# Patient Record
Sex: Female | Born: 1990 | Race: Black or African American | Hispanic: No | Marital: Single | Smoking: Current some day smoker
Health system: Southern US, Community
[De-identification: ages and names within clinical notes are randomized; demographics above are authoritative.]

## PROBLEM LIST (undated history)

## (undated) DIAGNOSIS — E119 Type 2 diabetes mellitus without complications: Secondary | ICD-10-CM

## (undated) HISTORY — PX: NO PAST SURGERIES: SHX2092

---
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
## 2010-01-24 ENCOUNTER — Emergency Department (HOSPITAL_COMMUNITY): Admission: EM | Admit: 2010-01-24 | Discharge: 2010-01-24 | Payer: Self-pay | Admitting: Emergency Medicine

## 2011-08-27 ENCOUNTER — Emergency Department (HOSPITAL_COMMUNITY): Payer: Self-pay

## 2011-08-27 ENCOUNTER — Emergency Department (HOSPITAL_COMMUNITY)
Admission: EM | Admit: 2011-08-27 | Discharge: 2011-08-27 | Disposition: A | Discharge status: Home / Self Care | Payer: Self-pay | Attending: Emergency Medicine | Admitting: Emergency Medicine

## 2011-08-27 DIAGNOSIS — T398X2A Poisoning by other nonopioid analgesics and antipyretics, not elsewhere classified, intentional self-harm, initial encounter: Secondary | ICD-10-CM | POA: Insufficient documentation

## 2011-08-27 DIAGNOSIS — F3289 Other specified depressive episodes: Secondary | ICD-10-CM | POA: Insufficient documentation

## 2011-08-27 DIAGNOSIS — T394X2A Poisoning by antirheumatics, not elsewhere classified, intentional self-harm, initial encounter: Secondary | ICD-10-CM | POA: Insufficient documentation

## 2011-08-27 DIAGNOSIS — T391X1A Poisoning by 4-Aminophenol derivatives, accidental (unintentional), initial encounter: Secondary | ICD-10-CM | POA: Insufficient documentation

## 2011-08-27 DIAGNOSIS — F329 Major depressive disorder, single episode, unspecified: Secondary | ICD-10-CM | POA: Insufficient documentation

## 2011-08-27 DIAGNOSIS — R072 Precordial pain: Secondary | ICD-10-CM | POA: Insufficient documentation

## 2011-08-27 DIAGNOSIS — J45909 Unspecified asthma, uncomplicated: Secondary | ICD-10-CM | POA: Insufficient documentation

## 2011-08-27 LAB — COMPREHENSIVE METABOLIC PANEL
ALT: 15 U/L (ref 0–35)
Alkaline Phosphatase: 75 U/L (ref 39–117)
Chloride: 102 mEq/L (ref 96–112)
GFR calc Af Amer: >60 mL/min (ref >60)
Glucose, Bld: 149 mg/dL — ABNORMAL HIGH (ref 70–99)
Potassium: 3.8 mEq/L (ref 3.5–5.1)
Sodium: 136 mEq/L (ref 135–145)
Total Protein: 7.1 g/dL (ref 6.0–8.3)

## 2011-08-27 LAB — DIFFERENTIAL
Basophils Absolute: 0 10*3/uL (ref 0.0–0.1)
Lymphocytes Relative: 30 % (ref 12–46)
Lymphs Abs: 2.3 10*3/uL (ref 0.7–4.0)
Monocytes Absolute: 0.4 10*3/uL (ref 0.1–1.0)
Monocytes Relative: 6 % (ref 3–12)
Neutro Abs: 4.9 10*3/uL (ref 1.7–7.7)

## 2011-08-27 LAB — RAPID URINE DRUG SCREEN, HOSP PERFORMED
Amphetamines: NOT DETECTED
Benzodiazepines: NOT DETECTED
Opiates: NOT DETECTED

## 2011-08-27 LAB — URINALYSIS, ROUTINE W REFLEX MICROSCOPIC
Glucose, UA: NEGATIVE mg/dL
Ketones, ur: NEGATIVE mg/dL
Leukocytes, UA: NEGATIVE
pH: 5.5 (ref 5.0–8.0)

## 2011-08-27 LAB — CBC
HCT: 35.2 % — ABNORMAL LOW (ref 36.0–46.0)
Hemoglobin: 11.3 g/dL — ABNORMAL LOW (ref 12.0–15.0)
MCH: 26 pg (ref 26.0–34.0)
MCHC: 32.1 g/dL (ref 30.0–36.0)
MCV: 81.1 fL (ref 78.0–100.0)

## 2011-08-27 LAB — PROTIME-INR: INR: 0.93 (ref 0.00–1.49)

## 2011-09-06 LAB — URINALYSIS, ROUTINE W REFLEX MICROSCOPIC
Bilirubin Urine: NEGATIVE
Glucose, UA: NEGATIVE
Hgb urine dipstick: NEGATIVE
Ketones, ur: NEGATIVE
pH: 6.5

## 2011-09-06 LAB — WET PREP, GENITAL
Trich, Wet Prep: NONE SEEN
Yeast Wet Prep HPF POC: NONE SEEN

## 2011-09-06 LAB — CBC
Platelets: 375
WBC: 13 — ABNORMAL HIGH

## 2012-01-24 ENCOUNTER — Encounter (HOSPITAL_COMMUNITY): Payer: Self-pay

## 2012-01-24 ENCOUNTER — Inpatient Hospital Stay (HOSPITAL_COMMUNITY): Payer: Self-pay

## 2012-01-24 ENCOUNTER — Inpatient Hospital Stay (HOSPITAL_COMMUNITY)
Admission: AD | Admit: 2012-01-24 | Discharge: 2012-01-25 | Disposition: A | Discharge status: Home / Self Care | Payer: Self-pay | Source: Ambulatory Visit | Attending: Family Medicine | Admitting: Family Medicine

## 2012-01-24 DIAGNOSIS — N949 Unspecified condition associated with female genital organs and menstrual cycle: Secondary | ICD-10-CM | POA: Insufficient documentation

## 2012-01-24 DIAGNOSIS — N926 Irregular menstruation, unspecified: Secondary | ICD-10-CM

## 2012-01-24 DIAGNOSIS — N938 Other specified abnormal uterine and vaginal bleeding: Secondary | ICD-10-CM | POA: Insufficient documentation

## 2012-01-24 DIAGNOSIS — N939 Abnormal uterine and vaginal bleeding, unspecified: Secondary | ICD-10-CM

## 2012-01-24 LAB — CBC
Hemoglobin: 10.5 g/dL — ABNORMAL LOW (ref 12.0–15.0)
Platelets: 358 10*3/uL (ref 150–400)
RBC: 4.1 MIL/uL (ref 3.87–5.11)
WBC: 13 10*3/uL — ABNORMAL HIGH (ref 4.0–10.5)

## 2012-01-24 LAB — URINALYSIS, ROUTINE W REFLEX MICROSCOPIC
Ketones, ur: NEGATIVE mg/dL
Leukocytes, UA: NEGATIVE
Nitrite: NEGATIVE
Specific Gravity, Urine: 1.025 (ref 1.005–1.030)
Urobilinogen, UA: 0.2 mg/dL (ref 0.0–1.0)
pH: 6 (ref 5.0–8.0)

## 2012-01-24 LAB — URINE MICROSCOPIC-ADD ON

## 2012-01-24 NOTE — ED Provider Notes (Signed)
Dana Crockett20 y.o.[id number] with negative UPT  Chief Complaint  Patient presents with  . Vaginal Bleeding    SUBJECTIVE  HPI: Pt presents with bright red vaginal bleeding x3 weeks, soaking 1pad every hour the entire time.  She denies abdominal pain/cramping, dizziness, fatigue, dysuria, vaginal itching/burning, n/v, fever/chills.  Past Medical History  Diagnosis Date  . No pertinent past medical history    Past Surgical History  Procedure Date  . No past surgeries    History   Social History  . Marital Status: Single    Spouse Name: N/A    Number of Children: N/A  . Years of Education: N/A   Occupational History  . Not on file.   Social History Main Topics  . Smoking status: Current Everyday Smoker    Types: Cigarettes  . Smokeless tobacco: Not on file  . Alcohol Use:   . Drug Use: No  . Sexually Active: Not Currently    Birth Control/ Protection: None   Other Topics Concern  . Not on file   Social History Narrative  . No narrative on file   No current facility-administered medications on file prior to encounter.   No current outpatient prescriptions on file prior to encounter.   No Known Allergies  ROS: Pertinent items in HPI  OBJECTIVE Blood pressure 125/63, pulse 98, temperature 98.1 F (36.7 C), resp. rate 18, height 5\' 4"  (1.626 m), weight 98.431 kg (217 lb), last menstrual period 01/03/2012. GENERAL: Well-developed, well-nourished female in no acute distress.  LUNGS: Clear to auscultation bilaterally.  HEART: Regular rate and rhythm. ABDOMEN: Soft, nontender EXTREMITIES: Nontender, no edema EXTERNAL GENITALIA: normal VAGINA: Bright red vaginal bleeding CERVIX: long/closed/high   RESULTS Results for orders placed during the hospital encounter of 01/24/12 (from the past 24 hour(s))  URINALYSIS, ROUTINE W REFLEX MICROSCOPIC     Status: Abnormal   Collection Time   01/24/12  9:10 PM      Component Value Range   Color, Urine RED (*) YELLOW      APPearance CLOUDY (*) CLEAR    Specific Gravity, Urine 1.025  1.005 - 1.030    pH 6.0  5.0 - 8.0    Glucose, UA NEGATIVE  NEGATIVE (mg/dL)   Hgb urine dipstick LARGE (*) NEGATIVE    Bilirubin Urine NEGATIVE  NEGATIVE    Ketones, ur NEGATIVE  NEGATIVE (mg/dL)   Protein, ur 161 (*) NEGATIVE (mg/dL)   Urobilinogen, UA 0.2  0.0 - 1.0 (mg/dL)   Nitrite NEGATIVE  NEGATIVE    Leukocytes, UA NEGATIVE  NEGATIVE   URINE MICROSCOPIC-ADD ON     Status: Abnormal   Collection Time   01/24/12  9:10 PM      Component Value Range   Squamous Epithelial / LPF RARE  RARE    WBC, UA 0-2  <3 (WBC/hpf)   RBC / HPF TOO NUMEROUS TO COUNT  <3 (RBC/hpf)   Bacteria, UA MANY (*) RARE   POCT PREGNANCY, URINE     Status: Normal   Collection Time   01/24/12  9:33 PM      Component Value Range   Preg Test, Ur NEGATIVE  NEGATIVE   CBC     Status: Abnormal   Collection Time   01/24/12 10:25 PM      Component Value Range   WBC 13.0 (*) 4.0 - 10.5 (K/uL)   RBC 4.10  3.87 - 5.11 (MIL/uL)   Hemoglobin 10.5 (*) 12.0 - 15.0 (g/dL)   HCT 09.6 (*) 04.5 -  46.0 (%)   MCV 81.7  78.0 - 100.0 (fL)   MCH 25.6 (*) 26.0 - 34.0 (pg)   MCHC 31.3  30.0 - 36.0 (g/dL)   RDW 16.1 (*) 09.6 - 15.5 (%)   Platelets 358  150 - 400 (K/uL)     IMAGING US Transvaginal Non-ob  01/24/2012  *RADIOLOGY REPORT*  Clinical Data: Vaginal bleeding for 4 weeks.  TRANSABDOMINAL AND TRANSVAGINAL ULTRASOUND OF PELVIS Technique:  Both transabdominal and transvaginal ultrasound examinations of the pelvis were performed. Transabdominal technique was performed for global imaging of the pelvis including uterus, ovaries, adnexal regions, and pelvic cul-de-sac.  Comparison: None.   It was necessary to proceed with endovaginal exam following the transabdominal exam to visualize the uterus and ovaries in greater detail.  Findings:  Uterus: Normal in size and appearance; measures 6.2 x 3.2 x 4.3 cm.  Endometrium: Normal in thickness and appearance; measures 0.7  cm in thickness.  Right ovary:  Normal appearance/no adnexal mass; measures 3.0 x 2.0 x 1.8 cm.  Left ovary: Normal appearance/no adnexal mass; measures 3.1 x 1.6 x 2.1 cm.  Other findings: No free fluid is seen within the pelvic cul-de-sac.  IMPRESSION: Normal study.  No evidence of pelvic mass or other significant abnormality.  Original Report Authenticated By: Tonia Ghent, M.D.   US Pelvis Complete  01/24/2012  *RADIOLOGY REPORT*  Clinical Data: Vaginal bleeding for 4 weeks.  TRANSABDOMINAL AND TRANSVAGINAL ULTRASOUND OF PELVIS Technique:  Both transabdominal and transvaginal ultrasound examinations of the pelvis were performed. Transabdominal technique was performed for global imaging of the pelvis including uterus, ovaries, adnexal regions, and pelvic cul-de-sac.  Comparison: None.   It was necessary to proceed with endovaginal exam following the transabdominal exam to visualize the uterus and ovaries in greater detail.  Findings:  Uterus: Normal in size and appearance; measures 6.2 x 3.2 x 4.3 cm.  Endometrium: Normal in thickness and appearance; measures 0.7 cm in thickness.  Right ovary:  Normal appearance/no adnexal mass; measures 3.0 x 2.0 x 1.8 cm.  Left ovary: Normal appearance/no adnexal mass; measures 3.1 x 1.6 x 2.1 cm.  Other findings: No free fluid is seen within the pelvic cul-de-sac.  IMPRESSION: Normal study.  No evidence of pelvic mass or other significant abnormality.  Original Report Authenticated By: Tonia Ghent, M.D.    ASSESSMENT Abnormal uterine bleeding  PLAN D/C home with bleeding precautions Provera 20 mg BID until bleeding stops, then 10 mg daily for total of 10 days of therapy F/U with Gyn clinic Return to MAU as needed

## 2012-01-24 NOTE — Progress Notes (Signed)
Pt reports "I have been on my PMS for four weeks" states she has been bleeding for 4 weeks and passing clots. Changing a pad q 1 hour that entire time. States she has been having headaches. Does not have a history of heavy periods.

## 2012-01-25 LAB — GC/CHLAMYDIA PROBE AMP, GENITAL: Chlamydia, DNA Probe: NEGATIVE

## 2012-01-25 MED ORDER — PROMETHAZINE HCL 50 MG PO TABS: 50.0000 mg | ORAL_TABLET | Freq: Four times a day (QID) | ORAL | Status: AC | PRN

## 2012-01-25 MED ORDER — IBUPROFEN 600 MG PO TABS
600.0000 mg | ORAL_TABLET | ORAL | Status: AC
  Administered 2012-01-25: 600 mg via ORAL
  Filled 2012-01-25: qty 1

## 2012-01-25 MED ORDER — MEDROXYPROGESTERONE ACETATE 10 MG PO TABS: 10.0000 mg | ORAL_TABLET | Freq: Every day | ORAL | Status: DC

## 2012-01-25 NOTE — ED Provider Notes (Signed)
Chart reviewed and agree with management and plan.  

## 2012-02-23 ENCOUNTER — Encounter: Payer: Self-pay | Admitting: Obstetrics and Gynecology

## 2018-01-23 ENCOUNTER — Encounter (HOSPITAL_BASED_OUTPATIENT_CLINIC_OR_DEPARTMENT_OTHER): Payer: Self-pay

## 2018-01-23 ENCOUNTER — Other Ambulatory Visit: Payer: Self-pay

## 2018-01-23 DIAGNOSIS — J111 Influenza due to unidentified influenza virus with other respiratory manifestations: Secondary | ICD-10-CM | POA: Insufficient documentation

## 2018-01-23 DIAGNOSIS — F17210 Nicotine dependence, cigarettes, uncomplicated: Secondary | ICD-10-CM | POA: Insufficient documentation

## 2018-01-23 MED ORDER — IBUPROFEN 800 MG PO TABS
800.0000 mg | ORAL_TABLET | Freq: Once | ORAL | Status: AC
  Administered 2018-01-23: 800 mg via ORAL
  Filled 2018-01-23: qty 1

## 2018-01-23 NOTE — ED Triage Notes (Signed)
C/o abd pain n/v/d x 1 week-NAD-steady  gait

## 2018-01-24 ENCOUNTER — Emergency Department (HOSPITAL_BASED_OUTPATIENT_CLINIC_OR_DEPARTMENT_OTHER)
Admission: EM | Admit: 2018-01-24 | Discharge: 2018-01-24 | Disposition: A | Discharge status: Home / Self Care | Payer: Self-pay | Attending: Emergency Medicine | Admitting: Emergency Medicine

## 2018-01-24 ENCOUNTER — Emergency Department (HOSPITAL_BASED_OUTPATIENT_CLINIC_OR_DEPARTMENT_OTHER): Payer: Self-pay

## 2018-01-24 DIAGNOSIS — R6889 Other general symptoms and signs: Secondary | ICD-10-CM

## 2018-01-24 LAB — URINALYSIS, MICROSCOPIC (REFLEX)

## 2018-01-24 LAB — PREGNANCY, URINE: PREG TEST UR: NEGATIVE

## 2018-01-24 LAB — URINALYSIS, ROUTINE W REFLEX MICROSCOPIC
BILIRUBIN URINE: NEGATIVE
Glucose, UA: NEGATIVE mg/dL
KETONES UR: 15 mg/dL — AB
Leukocytes, UA: NEGATIVE
Nitrite: NEGATIVE
Protein, ur: 30 mg/dL — AB
Specific Gravity, Urine: >1.03 — ABNORMAL HIGH (ref 1.005–1.030)
pH: 6 (ref 5.0–8.0)

## 2018-01-24 LAB — INFLUENZA PANEL BY PCR (TYPE A & B)
INFLBPCR: NEGATIVE
Influenza A By PCR: POSITIVE — AB

## 2018-01-24 MED ORDER — IBUPROFEN 600 MG PO TABS: 600.0000 mg | ORAL_TABLET | Freq: Four times a day (QID) | ORAL | 0 refills | Status: DC | PRN

## 2018-01-24 MED ORDER — SODIUM CHLORIDE 0.9 % IV BOLUS (SEPSIS)
1000.0000 mL | Freq: Once | INTRAVENOUS | Status: AC
Start: 2018-01-24 — End: 2018-01-24
  Administered 2018-01-24: 1000 mL via INTRAVENOUS

## 2018-01-24 MED ORDER — BENZONATATE 100 MG PO CAPS: 100.0000 mg | ORAL_CAPSULE | Freq: Three times a day (TID) | ORAL | 0 refills | Status: DC

## 2018-01-24 MED ORDER — ONDANSETRON HCL 4 MG/2ML IJ SOLN
4.0000 mg | Freq: Once | INTRAMUSCULAR | Status: AC
Start: 2018-01-24 — End: 2018-01-24
  Administered 2018-01-24: 4 mg via INTRAVENOUS
  Filled 2018-01-24: qty 2

## 2018-01-24 MED ORDER — ONDANSETRON 4 MG PO TBDP: 4.0000 mg | ORAL_TABLET | Freq: Three times a day (TID) | ORAL | 0 refills | Status: DC | PRN

## 2018-01-24 NOTE — ED Provider Notes (Signed)
[facility] EMERGENCY DEPARTMENT Provider Note   CSN: [id number] Arrival date & time: 01/23/18  2041     History   Chief Complaint Chief Complaint  Patient presents with  . Abdominal Pain    cough    HPI Dana Davenport is a 27 y.o. female.  HPI  This is a 27 year old female who presents with cough, fever, congestion, body aches and chills.  Patient reports 1 week history of similar symptoms.  She developed cough.  Since that time she has had nausea, vomiting, diarrhea.  She reported body aches.  She did not take her temperature at home but temperature of 102.8 upon arrival.  She has been exposed to a flu contact.  Cough is nonproductive.  She denies urinary symptoms.  She has taken over-the-counter medications with minimal relief.  She is a current smoker.  Past Medical History:  Diagnosis Date  . No pertinent past medical history     There are no active problems to display for this patient.   Past Surgical History:  Procedure Laterality Date  . NO PAST SURGERIES      OB History    Gravida Para Term Preterm AB Living   0 0 0 0 0 0   SAB TAB Ectopic Multiple Live Births   0 0 0 0         Home Medications    Prior to Admission medications   Medication Sig Start Date End Date Taking? Authorizing Provider  benzonatate (TESSALON) 100 MG capsule Take 1 capsule (100 mg total) by mouth every 8 (eight) hours. 01/24/18   Mirza Kidney, Mayer Maskerourtney F, MD  ibuprofen (ADVIL,MOTRIN) 600 MG tablet Take 1 tablet (600 mg total) by mouth every 6 (six) hours as needed. 01/24/18   Santos Sollenberger, Mayer Maskerourtney F, MD  ondansetron (ZOFRAN ODT) 4 MG disintegrating tablet Take 1 tablet (4 mg total) by mouth every 8 (eight) hours as needed for nausea or vomiting. 01/24/18   Yaqub Arney, Mayer Maskerourtney F, MD    Family History Family History  Problem Relation Age of Onset  . Anesthesia problems Neg Hx     Social History Social History   Tobacco Use  . Smoking status: Current Every Day Smoker    Types:  Cigarettes  . Smokeless tobacco: Never Used  Substance Use Topics  . Alcohol use: No    Frequency: Never  . Drug use: No     Allergies   Patient has no known allergies.   Review of Systems Review of Systems  Constitutional: Positive for chills and fever.  HENT: Positive for congestion.   Respiratory: Positive for cough. Negative for shortness of breath.   Cardiovascular: Negative for chest pain.  Gastrointestinal: Positive for abdominal pain, diarrhea, nausea and vomiting.  Genitourinary: Negative for dysuria.  Musculoskeletal: Positive for myalgias. Negative for back pain.  Skin: Negative for rash.  All other systems reviewed and are negative.    Physical Exam Updated Vital Signs BP 113/84 (BP Location: Right Arm)   Pulse 90   Temp 98.5 F (36.9 C) (Oral)   Resp 18   Ht 5\' 5"  (1.651 m)   Wt 104.4 kg (230 lb 3.2 oz)   LMP 01/02/2018   SpO2 100%   BMI 38.31 kg/m   Physical Exam  Constitutional: She is oriented to person, place, and time. She appears well-developed and well-nourished. She does not appear ill.  HENT:  Head: Normocephalic and atraumatic.  Mouth/Throat: No oropharyngeal exudate.  Mucous membranes dry  Eyes: Pupils are equal,  round, and reactive to light.  Neck: Neck supple.  Cardiovascular: Normal rate, regular rhythm and normal heart sounds.  Pulmonary/Chest: Effort normal and breath sounds normal. No respiratory distress. She has no wheezes.  Occasional cough, no significant wheezing or rales noted  Abdominal: Soft. Bowel sounds are normal. There is no tenderness.  Neurological: She is alert and oriented to person, place, and time.  Skin: Skin is warm and dry.  Psychiatric: She has a normal mood and affect.  Nursing note and vitals reviewed.    ED Treatments / Results  Labs (all labs ordered are listed, but only abnormal results are displayed) Labs Reviewed  URINALYSIS, ROUTINE W REFLEX MICROSCOPIC - Abnormal; Notable for the following  components:      Result Value   Specific Gravity, Urine >1.030 (*)    Hgb urine dipstick MODERATE (*)    Ketones, ur 15 (*)    Protein, ur 30 (*)    All other components within normal limits  URINALYSIS, MICROSCOPIC (REFLEX) - Abnormal; Notable for the following components:   Bacteria, UA RARE (*)    Squamous Epithelial / LPF 0-5 (*)    All other components within normal limits  PREGNANCY, URINE  INFLUENZA PANEL BY PCR (TYPE A & B)    EKG  EKG Interpretation None       Radiology Dg Chest 2 View  Result Date: 01/24/2018 CLINICAL DATA:  Cough with shortness of breath EXAM: CHEST  2 VIEW COMPARISON:  08/27/2011 FINDINGS: Heart size upper limits of normal. Both lungs are clear. The visualized skeletal structures are unremarkable. IMPRESSION: No active cardiopulmonary disease. Electronically Signed   By: Jasmine Pang M.D.   On: 01/24/2018 01:23    Procedures Procedures (including critical care time)  Medications Ordered in ED Medications  ibuprofen (ADVIL,MOTRIN) tablet 800 mg (800 mg Oral Given 01/23/18 2058)  sodium chloride 0.9 % bolus 1,000 mL (0 mLs Intravenous Stopped 01/24/18 0245)  ondansetron (ZOFRAN) injection 4 mg (4 mg Intravenous Given 01/24/18 0125)     Initial Impression / Assessment and Plan / ED Course  I have reviewed the triage vital signs and the nursing notes.  Pertinent labs & imaging results that were available during my care of the patient were reviewed by me and considered in my medical decision making (see chart for details).     She presents with flulike symptoms.  No contact.  Initial temperature elevated.  She is overall nontoxic appearing on exam and vital signs are otherwise reassuring.  She was given fluids and ibuprofen.  Urinalysis shows evidence of ketones to suggest mild dehydration.  Chest x-ray shows no evidence of hernia.  Influenza screen is pending.  Suspect flu.  She is otherwise healthy and nontoxic appearing.  She is out of the window  for Tamiflu.  Recommend supportive measures at home.  She is able to tolerate fluids without difficulty prior to discharge.  After history, exam, and medical workup I feel the patient has been appropriately medically screened and is safe for discharge home. Pertinent diagnoses were discussed with the patient. Patient was given return precautions.   Final Clinical Impressions(s) / ED Diagnoses   Final diagnoses:  Flu-like symptoms    ED Discharge Orders        Ordered    ibuprofen (ADVIL,MOTRIN) 600 MG tablet  Every 6 hours PRN     01/24/18 0309    ondansetron (ZOFRAN ODT) 4 MG disintegrating tablet  Every 8 hours PRN     01/24/18  0309    benzonatate (TESSALON) 100 MG capsule  Every 8 hours     01/24/18 0309       Nashya Garlington, Mayer Masker, MD 01/24/18 [phone]

## 2018-01-24 NOTE — ED Notes (Signed)
Given po fluids 

## 2018-01-24 NOTE — ED Notes (Signed)
Flu like symptoms x 1 week . Has been around family with dx of flu. Cough, congestion and aching all over

## 2018-01-24 NOTE — Discharge Instructions (Signed)
You were seen today for multiple and symptoms including cough, GI symptoms.  You may have fluids up.  Flu testing is pending.  Call for results later today.  Supportive measures including hydration, ibuprofen for body aches and pains, and Zofran for nausea or vomiting.

## 2018-01-24 NOTE — ED Notes (Signed)
Given sprite and graham crackers  

## 2018-01-24 NOTE — ED Notes (Signed)
Patient transported to X-ray 

## 2018-02-27 ENCOUNTER — Observation Stay (HOSPITAL_COMMUNITY)
Admission: EM | Admit: 2018-02-27 | Discharge: 2018-02-28 | Disposition: A | Discharge status: Home / Self Care | Payer: Self-pay | Attending: Family Medicine | Admitting: Family Medicine

## 2018-02-27 ENCOUNTER — Other Ambulatory Visit: Payer: Self-pay

## 2018-02-27 ENCOUNTER — Encounter (HOSPITAL_COMMUNITY): Payer: Self-pay

## 2018-02-27 DIAGNOSIS — Z6837 Body mass index (BMI) 37.0-37.9, adult: Secondary | ICD-10-CM | POA: Insufficient documentation

## 2018-02-27 DIAGNOSIS — Z794 Long term (current) use of insulin: Secondary | ICD-10-CM | POA: Insufficient documentation

## 2018-02-27 DIAGNOSIS — R059 Cough, unspecified: Secondary | ICD-10-CM

## 2018-02-27 DIAGNOSIS — Z79899 Other long term (current) drug therapy: Secondary | ICD-10-CM | POA: Insufficient documentation

## 2018-02-27 DIAGNOSIS — E0810 Diabetes mellitus due to underlying condition with ketoacidosis without coma: Secondary | ICD-10-CM

## 2018-02-27 DIAGNOSIS — E119 Type 2 diabetes mellitus without complications: Secondary | ICD-10-CM

## 2018-02-27 DIAGNOSIS — R05 Cough: Secondary | ICD-10-CM

## 2018-02-27 DIAGNOSIS — Z87891 Personal history of nicotine dependence: Secondary | ICD-10-CM | POA: Insufficient documentation

## 2018-02-27 DIAGNOSIS — E1110 Type 2 diabetes mellitus with ketoacidosis without coma: Principal | ICD-10-CM | POA: Insufficient documentation

## 2018-02-27 LAB — GLUCOSE, CAPILLARY
Glucose-Capillary: 142 mg/dL — ABNORMAL HIGH (ref 65–99)
Glucose-Capillary: 148 mg/dL — ABNORMAL HIGH (ref 65–99)

## 2018-02-27 LAB — I-STAT BETA HCG BLOOD, ED (MC, WL, AP ONLY)

## 2018-02-27 LAB — CBC
HCT: 37 % (ref 36.0–46.0)
HEMATOCRIT: 31 % — AB (ref 36.0–46.0)
HEMOGLOBIN: 11.2 g/dL — AB (ref 12.0–15.0)
Hemoglobin: 9.5 g/dL — ABNORMAL LOW (ref 12.0–15.0)
MCH: 22.8 pg — AB (ref 26.0–34.0)
MCH: 23.1 pg — ABNORMAL LOW (ref 26.0–34.0)
MCHC: 30.3 g/dL (ref 30.0–36.0)
MCHC: 30.6 g/dL (ref 30.0–36.0)
MCV: 75.4 fL — AB (ref 78.0–100.0)
MCV: 75.4 fL — ABNORMAL LOW (ref 78.0–100.0)
PLATELETS: 367 10*3/uL (ref 150–400)
Platelets: 290 10*3/uL (ref 150–400)
RBC: 4.91 MIL/uL (ref 3.87–5.11)
RBC: 4.11 MIL/uL (ref 3.87–5.11)
RDW: 17.5 % — AB (ref 11.5–15.5)
RDW: 17.5 % — AB (ref 11.5–15.5)
WBC: 9.9 10*3/uL (ref 4.0–10.5)
WBC: 9.7 10*3/uL (ref 4.0–10.5)

## 2018-02-27 LAB — CBG MONITORING, ED
GLUCOSE-CAPILLARY: 194 mg/dL — AB (ref 65–99)
GLUCOSE-CAPILLARY: 173 mg/dL — AB (ref 65–99)
Glucose-Capillary: 348 mg/dL — ABNORMAL HIGH (ref 65–99)
Glucose-Capillary: 304 mg/dL — ABNORMAL HIGH (ref 65–99)
Glucose-Capillary: 259 mg/dL — ABNORMAL HIGH (ref 65–99)
Glucose-Capillary: 176 mg/dL — ABNORMAL HIGH (ref 65–99)
Glucose-Capillary: 189 mg/dL — ABNORMAL HIGH (ref 65–99)

## 2018-02-27 LAB — URINALYSIS, ROUTINE W REFLEX MICROSCOPIC
Bacteria, UA: NONE SEEN
Bilirubin Urine: NEGATIVE
Hgb urine dipstick: NEGATIVE
Ketones, ur: 80 mg/dL — AB
Leukocytes, UA: NEGATIVE
Nitrite: NEGATIVE
PH: 5 (ref 5.0–8.0)
Protein, ur: 30 mg/dL — AB
SPECIFIC GRAVITY, URINE: 1.027 (ref 1.005–1.030)

## 2018-02-27 LAB — BASIC METABOLIC PANEL
Anion gap: 17 — ABNORMAL HIGH (ref 5–15)
Anion gap: 10 (ref 5–15)
BUN: 7 mg/dL (ref 6–20)
BUN: 6 mg/dL (ref 6–20)
CO2: 15 mmol/L — ABNORMAL LOW (ref 22–32)
CO2: 13 mmol/L — ABNORMAL LOW (ref 22–32)
Calcium: 9.2 mg/dL (ref 8.9–10.3)
Calcium: 8.1 mg/dL — ABNORMAL LOW (ref 8.9–10.3)
Chloride: 102 mmol/L (ref 101–111)
Chloride: 117 mmol/L — ABNORMAL HIGH (ref 101–111)
Creatinine, Ser: 1.04 mg/dL — ABNORMAL HIGH (ref 0.44–1.00)
Creatinine, Ser: 0.95 mg/dL (ref 0.44–1.00)
GFR calc Af Amer: >60 mL/min (ref >60)
GFR calc Af Amer: >60 mL/min (ref >60)
GFR calc non Af Amer: >60 mL/min (ref >60)
GLUCOSE: 186 mg/dL — AB (ref 65–99)
Glucose, Bld: 355 mg/dL — ABNORMAL HIGH (ref 65–99)
POTASSIUM: 3.9 mmol/L (ref 3.5–5.1)
POTASSIUM: 4.2 mmol/L (ref 3.5–5.1)
SODIUM: 134 mmol/L — AB (ref 135–145)
Sodium: 140 mmol/L (ref 135–145)

## 2018-02-27 LAB — CREATININE, SERUM
Creatinine, Ser: 0.88 mg/dL (ref 0.44–1.00)
GFR calc non Af Amer: >60 mL/min (ref >60)

## 2018-02-27 LAB — MRSA PCR SCREENING: MRSA by PCR: POSITIVE — AB

## 2018-02-27 MED ORDER — CHLORHEXIDINE GLUCONATE CLOTH 2 % EX PADS
6.0000 | MEDICATED_PAD | Freq: Every day | CUTANEOUS | Status: DC
  Administered 2018-02-28: 6 via TOPICAL

## 2018-02-27 MED ORDER — POTASSIUM CHLORIDE 10 MEQ/100ML IV SOLN
10.0000 meq | INTRAVENOUS | Status: AC
  Administered 2018-02-27 (×2): 10 meq via INTRAVENOUS
  Filled 2018-02-27 (×2): qty 100

## 2018-02-27 MED ORDER — DEXTROSE-NACL 5-0.45 % IV SOLN: INTRAVENOUS | Status: DC

## 2018-02-27 MED ORDER — SODIUM CHLORIDE 0.9 % IV BOLUS
2000.0000 mL | Freq: Once | INTRAVENOUS | Status: AC
  Administered 2018-02-27: 2000 mL via INTRAVENOUS

## 2018-02-27 MED ORDER — DEXTROSE-NACL 5-0.45 % IV SOLN
INTRAVENOUS | Status: DC
  Administered 2018-02-27 – 2018-02-28 (×2): via INTRAVENOUS

## 2018-02-27 MED ORDER — ENOXAPARIN SODIUM 40 MG/0.4ML ~~LOC~~ SOLN
40.0000 mg | SUBCUTANEOUS | Status: DC
  Administered 2018-02-27: 40 mg via SUBCUTANEOUS
  Filled 2018-02-27: qty 0.4

## 2018-02-27 MED ORDER — SODIUM CHLORIDE 0.9 % IV SOLN
INTRAVENOUS | Status: DC
  Administered 2018-02-27: 2.4 [IU]/h via INTRAVENOUS
  Filled 2018-02-27: qty 1

## 2018-02-27 MED ORDER — SODIUM CHLORIDE 0.9 % IV SOLN
INTRAVENOUS | Status: DC
  Administered 2018-02-27: 16:00:00 via INTRAVENOUS

## 2018-02-27 MED ORDER — SODIUM CHLORIDE 0.9 % IV SOLN: INTRAVENOUS | Status: DC

## 2018-02-27 MED ORDER — SODIUM CHLORIDE 0.9 % IV SOLN
INTRAVENOUS | Status: DC
  Administered 2018-02-27: 4 [IU]/h via INTRAVENOUS
  Administered 2018-02-28: 7.4 [IU]/h via INTRAVENOUS
  Filled 2018-02-27 (×2): qty 1

## 2018-02-27 MED ORDER — SODIUM CHLORIDE 0.9 % IV BOLUS
1000.0000 mL | Freq: Once | INTRAVENOUS | Status: AC
  Administered 2018-02-27: 1000 mL via INTRAVENOUS

## 2018-02-27 MED ORDER — SODIUM CHLORIDE 0.9 % IV BOLUS: 1000.0000 mL | Freq: Once | INTRAVENOUS | Status: DC

## 2018-02-27 MED ORDER — MUPIROCIN 2 % EX OINT
1.0000 "application " | TOPICAL_OINTMENT | Freq: Two times a day (BID) | CUTANEOUS | Status: DC
  Administered 2018-02-27 – 2018-02-28 (×2): 1 via NASAL
  Filled 2018-02-27: qty 22

## 2018-02-27 NOTE — ED Triage Notes (Addendum)
Patient c/o mid abdominal pain and nausea x 3 days. Patient c/o near syncope since yesterday.

## 2018-02-27 NOTE — ED Provider Notes (Signed)
[facility]-EMERGENCY DEPT Provider Note   CSN: [id number] Arrival date & time: 02/27/18  1254     History   Chief Complaint Chief Complaint  Patient presents with  . Abdominal Pain  . Nausea  . Near Syncope  . Hyperglycemia    HPI Dana Davenport is a 27 y.o. female who presents the emergency department with chief complaint of abdominal pain and nausea.  Patient states that she began having diffuse, crampy, constant abdominal pain 3 days ago with associated polyuria and polydipsia.  She has no previous past medical history.  She denies diarrhea fevers or chills but had one episode of vomiting yesterday.  No previous abdominal surgeries.  She does not follow with a primary care physician.  HPI  Past Medical History:  Diagnosis Date  . No pertinent past medical history     There are no active problems to display for this patient.   Past Surgical History:  Procedure Laterality Date  . NO PAST SURGERIES       OB History    Gravida  0   Para  0   Term  0   Preterm  0   AB  0   Living  0     SAB  0   TAB  0   Ectopic  0   Multiple  0   Live Births               Home Medications    Prior to Admission medications   Medication Sig Start Date End Date Taking? Authorizing Provider  benzonatate (TESSALON) 100 MG capsule Take 1 capsule (100 mg total) by mouth every 8 (eight) hours. Patient not taking: Reported on 02/27/2018 01/24/18   Horton, Mayer Maskerourtney F, MD  ibuprofen (ADVIL,MOTRIN) 600 MG tablet Take 1 tablet (600 mg total) by mouth every 6 (six) hours as needed. Patient not taking: Reported on 02/27/2018 01/24/18   Horton, Mayer Maskerourtney F, MD  ondansetron (ZOFRAN ODT) 4 MG disintegrating tablet Take 1 tablet (4 mg total) by mouth every 8 (eight) hours as needed for nausea or vomiting. Patient not taking: Reported on 02/27/2018 01/24/18   Horton, Mayer Maskerourtney F, MD    Family History Family History  Problem Relation Age of Onset  . Kidney  Stones Mother   . Anesthesia problems Neg Hx     Social History Social History   Tobacco Use  . Smoking status: Former Smoker    Types: Cigarettes  . Smokeless tobacco: Never Used  Substance Use Topics  . Alcohol use: No    Frequency: Never  . Drug use: No     Allergies   Patient has no known allergies.   Review of Systems Review of Systems  Ten systems reviewed and are negative for acute change, except as noted in the HPI.   Physical Exam Updated Vital Signs BP 124/77 (BP Location: Right Arm)   Pulse 100   Temp 98.3 F (36.8 C) (Oral)   Resp 16   Ht 5\' 5"  (1.651 m)   Wt 103.4 kg (228 lb)   LMP 02/10/2018 (Approximate)   SpO2 100%   BMI 37.94 kg/m   Physical Exam  Constitutional: She is oriented to person, place, and time. She appears well-developed and well-nourished. She appears ill.  Obese female in no acute distress  HENT:  Head: Normocephalic and atraumatic.  Dry oral mucosa  Eyes: Pupils are equal, round, and reactive to light. Conjunctivae and EOM are normal. No scleral icterus.  Neck: Normal range of motion.  Cardiovascular: Regular rhythm and normal heart sounds. Exam reveals no gallop and no friction rub.  No murmur heard. Tachycardic  Pulmonary/Chest: Effort normal and breath sounds normal. No respiratory distress.  Abdominal: Soft. Bowel sounds are normal. She exhibits no distension and no mass. There is generalized tenderness. There is no guarding.  Neurological: She is alert and oriented to person, place, and time.  Skin: Skin is warm and dry. She is not diaphoretic.  Psychiatric: Her behavior is normal.  Nursing note and vitals reviewed.    ED Treatments / Results  Labs (all labs ordered are listed, but only abnormal results are displayed) Labs Reviewed  BASIC METABOLIC PANEL - Abnormal; Notable for the following components:      Result Value   Sodium 134 (*)    CO2 15 (*)    Glucose, Bld 355 (*)    Creatinine, Ser 1.04 (*)     Anion gap 17 (*)    All other components within normal limits  CBC - Abnormal; Notable for the following components:   Hemoglobin 11.2 (*)    MCV 75.4 (*)    MCH 22.8 (*)    RDW 17.5 (*)    All other components within normal limits  URINALYSIS, ROUTINE W REFLEX MICROSCOPIC - Abnormal; Notable for the following components:   Color, Urine STRAW (*)    Glucose, UA >=500 (*)    Ketones, ur 80 (*)    Protein, ur 30 (*)    Squamous Epithelial / LPF 0-5 (*)    All other components within normal limits  BASIC METABOLIC PANEL - Abnormal; Notable for the following components:   Chloride 117 (*)    CO2 13 (*)    Glucose, Bld 186 (*)    Calcium 8.1 (*)    All other components within normal limits  CBC - Abnormal; Notable for the following components:   Hemoglobin 9.5 (*)    HCT 31.0 (*)    MCV 75.4 (*)    MCH 23.1 (*)    RDW 17.5 (*)    All other components within normal limits  GLUCOSE, CAPILLARY - Abnormal; Notable for the following components:   Glucose-Capillary 142 (*)    All other components within normal limits  CBG MONITORING, ED - Abnormal; Notable for the following components:   Glucose-Capillary 348 (*)    All other components within normal limits  CBG MONITORING, ED - Abnormal; Notable for the following components:   Glucose-Capillary 304 (*)    All other components within normal limits  CBG MONITORING, ED - Abnormal; Notable for the following components:   Glucose-Capillary 259 (*)    All other components within normal limits  CBG MONITORING, ED - Abnormal; Notable for the following components:   Glucose-Capillary 194 (*)    All other components within normal limits  CBG MONITORING, ED - Abnormal; Notable for the following components:   Glucose-Capillary 176 (*)    All other components within normal limits  CBG MONITORING, ED - Abnormal; Notable for the following components:   Glucose-Capillary 189 (*)    All other components within normal limits  CBG MONITORING, ED  - Abnormal; Notable for the following components:   Glucose-Capillary 173 (*)    All other components within normal limits  MRSA PCR SCREENING  CREATININE, SERUM  HIV ANTIBODY (ROUTINE TESTING)  BASIC METABOLIC PANEL  BASIC METABOLIC PANEL  BASIC METABOLIC PANEL  BASIC METABOLIC PANEL  I-STAT BETA HCG BLOOD, ED (  [facility], [facility], [facility] ONLY)    EKG None  Radiology No results found.  Procedures .Critical Care Performed by: Arthor Captain, PA-C Authorized by: Arthor Captain, PA-C   Critical care provider statement:    Critical care time (minutes):  40   Critical care was necessary to treat or prevent imminent or life-threatening deterioration of the following conditions:  Metabolic crisis   Critical care was time spent personally by me on the following activities:  Development of treatment plan with patient or surrogate, discussions with consultants, evaluation of patient's response to treatment, examination of patient, interpretation of cardiac output measurements, ordering and performing treatments and interventions, ordering and review of laboratory studies, ordering and review of radiographic studies and re-evaluation of patient's condition   (including critical care time)  Medications Ordered in ED Medications  sodium chloride 0.9 % bolus 1,000 mL (has no administration in time range)  dextrose 5 %-0.45 % sodium chloride infusion (has no administration in time range)  insulin regular (NOVOLIN R,HUMULIN R) 100 Units in sodium chloride 0.9 % 100 mL (1 Units/mL) infusion (has no administration in time range)  sodium chloride 0.9 % bolus 1,000 mL (has no administration in time range)    And  0.9 %  sodium chloride infusion (has no administration in time range)     Initial Impression / Assessment and Plan / ED Course  I have reviewed the triage vital signs and the nursing notes.  Pertinent labs & imaging results that were available during my care of the patient were reviewed by me and  considered in my medical decision making (see chart for details).  Clinical Course as of Feb 27 2246  Mon Feb 27, 2018  1436 Sodium(!): 134 [AH]  1436 Glucose(!): 355 [AH]  1437 Creatinine(!): 1.04 [AH]  1437 Patient with elevated blood sugar, urine is pending.  Suspected DKA and new onset diabetes.  I have ordered glucose stabilizer and fluids.  Anion gap(!): 17 [AH]    Clinical Course User Index [AH] Arthor Captain, PA-C    Patient with new onset DKA and diabetes.  Treated in the ER with glucose stabilizer.  Patient will be admitted by the hospitalist service.  She is stable throughout her ED visit  Final Clinical Impressions(s) / ED Diagnoses   Final diagnoses:  Diabetic ketoacidosis without coma associated with diabetes mellitus due to underlying condition [facility])  Diabetes mellitus, new onset [facility])    ED Discharge Orders    None       Arthor Captain, PA-C 02/27/18 2250    Pricilla Loveless, MD 02/28/18 1929

## 2018-02-27 NOTE — ED Notes (Signed)
EKG AND CBG WAS PERFORMED IN TRIAGE.

## 2018-02-27 NOTE — ED Notes (Signed)
BLUE SAVE TUBE IN MAIN LAB 

## 2018-02-27 NOTE — H&P (Signed)
History and Physical    Dana Davenport ZOX:[id number] DOB: 06-18-1991  DOA: 02/27/2018 PCP: Patient, No Pcp Per  Patient coming from: Home  Chief Complaint: Nausea vomiting and abdominal pain  HPI: Dana Davenport is a 27 y.o. female with medical history significant of no significant medical history presented to the emergency department complaining of nausea, vomiting, abdominal pain associated with vomiting.  She reports the symptoms started about 3 days ago where she was starting to having diffuse crampy abdominal pain and subsequently started feeling very thirsty and was urinating more frequent.  Patient reported nothing makes the symptoms better or worse.  Patient reported that she was sick with the flu about 3 weeks ago the symptoms resolve.  Patient denied fever, chills, diarrhea and dysuria.  No sick contacts  ED Course: Found to be dehydrated, lab workup revealed glucose of 355, creatinine of 1.05, anion gap 17 and sodium of 134.  Patient was given IV fluids and started on insulin drip.  [facility] consulted for admission.  Review of Systems:   General: no changes in body weight, no fever chills or decrease in energy.  HEENT: no blurry vision, hearing changes or sore throat Respiratory: no dyspnea, coughing, wheezing CV: no chest pain, no palpitations GI:  See HPI GU: no dysuria, burning on urination, hematuria  Ext:. No deformities,  Neuro: no unilateral weakness, numbness, or tingling, no vision change or hearing loss Skin: No rashes, lesions or wounds. MSK: No muscle spasm, no deformity, no limitation of range of movement in spin Heme: No easy bruising.  Travel history: No recent long distant travel.   Past Medical History:  Diagnosis Date  . No pertinent past medical history     Past Surgical History:  Procedure Laterality Date  . NO PAST SURGERIES       reports that she has quit smoking. Her smoking use included cigarettes. She has never used smokeless  tobacco. She reports that she does not drink alcohol or use drugs.  No Known Allergies  Family History  Problem Relation Age of Onset  . Kidney Stones Mother   . Anesthesia problems Neg Hx     Prior to Admission medications   Medication Sig Start Date End Date Taking? Authorizing Provider  benzonatate (TESSALON) 100 MG capsule Take 1 capsule (100 mg total) by mouth every 8 (eight) hours. Patient not taking: Reported on 02/27/2018 01/24/18   Horton, Mayer Masker, MD  ibuprofen (ADVIL,MOTRIN) 600 MG tablet Take 1 tablet (600 mg total) by mouth every 6 (six) hours as needed. Patient not taking: Reported on 02/27/2018 01/24/18   Horton, Mayer Masker, MD  ondansetron (ZOFRAN ODT) 4 MG disintegrating tablet Take 1 tablet (4 mg total) by mouth every 8 (eight) hours as needed for nausea or vomiting. Patient not taking: Reported on 02/27/2018 01/24/18   Shon Baton, MD    Physical Exam: Vitals:   02/27/18 1313 02/27/18 1318  BP: 124/77   Pulse: 100   Resp: 16   Temp: 98.3 F (36.8 C)   TempSrc: Oral   SpO2: 100%   Weight:  103.4 kg (228 lb)  Height:  5\' 5"  (1.651 m)     Constitutional: NAD, calm, comfortable Eyes: PERRL, lids and conjunctivae normal ENMT: Mucous membranes are moist. Posterior pharynx clear of any exudate or lesions.Normal dentition.  Neck: normal, supple, no masses, no thyromegaly Respiratory: clear to auscultation bilaterally, no wheezing, no crackles. Normal respiratory effort. No accessory muscle use.  Cardiovascular: Regular rate and rhythm, no  murmurs / rubs / gallops. No extremity edema. 2+ pedal pulses. No carotid bruits.  Abdomen: no tenderness, no masses palpated. No hepatosplenomegaly. Bowel sounds positive.  Musculoskeletal: no clubbing / cyanosis. No joint deformity upper and lower extremities. Good ROM, no contractures. Normal muscle tone.  Skin: no rashes, lesions, ulcers. No induration Neurologic: CN 2-12 grossly intact. Sensation intact, DTR normal.  Strength 5/5 in all 4.  Psychiatric: Normal judgment and insight. Alert and oriented x 3. Normal mood.    Labs on Admission: I have personally reviewed following labs and imaging studies  CBC: Recent Labs  Lab 02/27/18 1346  WBC 9.9  HGB 11.2*  HCT 37.0  MCV 75.4*  PLT 367   Basic Metabolic Panel: Recent Labs  Lab 02/27/18 1346  NA 134*  K 3.9  CL 102  CO2 15*  GLUCOSE 355*  BUN 7  CREATININE 1.04*  CALCIUM 9.2   GFR: Estimated Creatinine Clearance: 97 mL/min (A) (by C-G formula based on SCr of 1.04 mg/dL (H)). Liver Function Tests: No results for input(s): AST, ALT, ALKPHOS, BILITOT, PROT, ALBUMIN in the last 168 hours. No results for input(s): LIPASE, AMYLASE in the last 168 hours. No results for input(s): AMMONIA in the last 168 hours. Coagulation Profile: No results for input(s): INR, PROTIME in the last 168 hours. Cardiac Enzymes: No results for input(s): CKTOTAL, CKMB, CKMBINDEX, TROPONINI in the last 168 hours. BNP (last 3 results) No results for input(s): PROBNP in the last 8760 hours. HbA1C: No results for input(s): HGBA1C in the last 72 hours. CBG: Recent Labs  Lab 02/27/18 1334 02/27/18 1512  GLUCAP 348* 304*   Lipid Profile: No results for input(s): CHOL, HDL, LDLCALC, TRIG, CHOLHDL, LDLDIRECT in the last 72 hours. Thyroid Function Tests: No results for input(s): TSH, T4TOTAL, FREET4, T3FREE, THYROIDAB in the last 72 hours. Anemia Panel: No results for input(s): VITAMINB12, FOLATE, FERRITIN, TIBC, IRON, RETICCTPCT in the last 72 hours. Urine analysis:    Component Value Date/Time   COLORURINE YELLOW 01/24/2018 0010   APPEARANCEUR CLEAR 01/24/2018 0010   LABSPEC >1.030 (H) 01/24/2018 0010   PHURINE 6.0 01/24/2018 0010   GLUCOSEU NEGATIVE 01/24/2018 0010   HGBUR MODERATE (A) 01/24/2018 0010   BILIRUBINUR NEGATIVE 01/24/2018 0010   KETONESUR 15 (A) 01/24/2018 0010   PROTEINUR 30 (A) 01/24/2018 0010   UROBILINOGEN 0.2 01/24/2012 2110    NITRITE NEGATIVE 01/24/2018 0010   LEUKOCYTESUR NEGATIVE 01/24/2018 0010   Sepsis Labs: !!!!!!!!!!!!!!!!!!!!!!!!!!!!!!!!!!!!!!!!!!!! @LABRCNTIP (procalcitonin:4,lacticidven:4) )No results found for this or any previous visit (from the past 240 hour(s)).   Radiological Exams on Admission: No results found.  EKG: Independently reviewed by myself, sinus tachycardia  Assessment/Plan DKA - new diagnosis  Family history of diabetes brother is diabetic type I.  Anion gap 17 Admit to stepdown Started on insulin drip, continue IV fluid.  Start D5W when glucose below 250.  Transition to subcu insulin when anion gap closed Monitor BMP every 4-hour, CBGs every hour N.p.o. until CO2 above 15 Obtain A1c I suspect that she will recover quickly as patient seems to be stable, may be able to discharge in the morning if stable on insulin or oral hypoglycemic agents.  DVT prophylaxis: Lovenox Code Status: Full code Family Communication: Family at bedside Disposition Plan: Anticipate discharge to previous home environment.  Consults called: None Admission status: SDU/observation  Latrelle DodrillEdwin Silva MD [facility] Pager: Text Page via [email or web address]  [phone]  If 7PM-7AM, please contact night-coverage [email or web address] Password [email or web address]  02/27/2018, 3:57 PM

## 2018-02-27 NOTE — Progress Notes (Signed)
CRITICAL VALUE ALERT  Critical Value:  MRSA PCR +  Date & Time Notied:  02/27/2014  Provider Notified: [facility] On Call  Orders Received/Actions taken: Initiated MRSA + PCR Protocol

## 2018-02-27 NOTE — ED Notes (Signed)
ED TO INPATIENT HANDOFF REPORT  Name/Age/Gender Dana Davenport 27 y.o. female  Code Status    Code Status Orders  (From admission, onward)        Start     Ordered   02/27/18 1555  Full code  Continuous     02/27/18 1556    Code Status History    This patient has a current code status but no historical code status.      Home/SNF/Other Home  Chief Complaint abd pain; neauses; a1c conerns  Level of Care/Admitting Diagnosis ED Disposition    ED Disposition Condition Comment   Admit  Hospital Area: [facility] [[id number]]  Level of Care: Stepdown [14]  Admit to SDU based on following criteria: Severe physiological/psychological symptoms:  Any diagnosis requiring assessment & intervention at least every 4 hours on an ongoing basis to obtain desired patient outcomes including stability and rehabilitation  Diagnosis: DKA (diabetic ketoacidoses) [facility]) [093112]  Admitting Physician: Patrecia Pour, EDWIN [[id number]]  Attending Physician: Patrecia Pour, EDWIN [[id number]]  PT Class (Do Not Modify): Observation [104]  PT Acc Code (Do Not Modify): Observation [10022]       Medical History Past Medical History:  Diagnosis Date  . No pertinent past medical history     Allergies No Known Allergies  IV Location/Drains/Wounds Patient Lines/Drains/Airways Status   Active Line/Drains/Airways    Name:   Placement date:   Placement time:   Site:   Days:   Peripheral IV 02/27/18 Left Antecubital   02/27/18    1458    Antecubital   less than 1          Labs/Imaging Results for orders placed or performed during the hospital encounter of 02/27/18 (from the past 48 hour(s))  CBG monitoring, ED     Status: Abnormal   Collection Time: 02/27/18  1:34 PM  Result Value Ref Range   Glucose-Capillary 348 (H) 65 - 99 mg/dL  Basic metabolic panel     Status: Abnormal   Collection Time: 02/27/18  1:46 PM  Result Value Ref Range   Sodium 134 (L) 135 - 145 mmol/L   Potassium 3.9 3.5 - 5.1 mmol/L   Chloride 102 101 - 111 mmol/L   CO2 15 (L) 22 - 32 mmol/L   Glucose, Bld 355 (H) 65 - 99 mg/dL   BUN 7 6 - 20 mg/dL   Creatinine, Ser 1.04 (H) 0.44 - 1.00 mg/dL   Calcium 9.2 8.9 - 10.3 mg/dL   GFR calc non Af Amer >60 >60 mL/min   GFR calc Af Amer >60 >60 mL/min    Comment: (NOTE) The eGFR has been calculated using the CKD EPI equation. This calculation has not been validated in all clinical situations. eGFR's persistently <60 mL/min signify possible Chronic Kidney Disease.    Anion gap 17 (H) 5 - 15    Comment: Performed at [facility], [place] [address] [place], [place] [address]  CBC     Status: Abnormal   Collection Time: 02/27/18  1:46 PM  Result Value Ref Range   WBC 9.9 4.0 - 10.5 K/uL   RBC 4.91 3.87 - 5.11 MIL/uL   Hemoglobin 11.2 (L) 12.0 - 15.0 g/dL   HCT 37.0 36.0 - 46.0 %   MCV 75.4 (L) 78.0 - 100.0 fL   MCH 22.8 (L) 26.0 - 34.0 pg   MCHC 30.3 30.0 - 36.0 g/dL   RDW 17.5 (H) 11.5 - 15.5 %   Platelets 367 150 -  400 K/uL    Comment: Performed at [facility], [place] [address], [place], [place] [address]  I-Stat beta hCG blood, ED     Status: None   Collection Time: 02/27/18  1:51 PM  Result Value Ref Range   I-stat hCG, quantitative <5.0 <5 mIU/mL   Comment 3            Comment:   GEST. AGE      CONC.  (mIU/mL)   <=1 WEEK        5 - 50     2 WEEKS       50 - 500     3 WEEKS       100 - 10,000     4 WEEKS     1,000 - 30,000        FEMALE AND NON-PREGNANT FEMALE:     LESS THAN 5 mIU/mL   CBG monitoring, ED     Status: Abnormal   Collection Time: 02/27/18  3:12 PM  Result Value Ref Range   Glucose-Capillary 304 (H) 65 - 99 mg/dL  Urinalysis, Routine w reflex microscopic     Status: Abnormal   Collection Time: 02/27/18  4:12 PM  Result Value Ref Range   Color, Urine STRAW (A) YELLOW   APPearance CLEAR CLEAR   Specific Gravity, Urine 1.027 1.005 - 1.030   pH 5.0 5.0 - 8.0   Glucose, UA  >=500 (A) NEGATIVE mg/dL   Hgb urine dipstick NEGATIVE NEGATIVE   Bilirubin Urine NEGATIVE NEGATIVE   Ketones, ur 80 (A) NEGATIVE mg/dL   Protein, ur 30 (A) NEGATIVE mg/dL   Nitrite NEGATIVE NEGATIVE   Leukocytes, UA NEGATIVE NEGATIVE   RBC / HPF 0-5 0 - 5 RBC/hpf   WBC, UA 0-5 0 - 5 WBC/hpf   Bacteria, UA NONE SEEN NONE SEEN   Squamous Epithelial / LPF 0-5 (A) NONE SEEN   Mucus PRESENT     Comment: Performed at [facility], [place] [address] [place], [place] [address]  CBG monitoring, ED     Status: Abnormal   Collection Time: 02/27/18  4:16 PM  Result Value Ref Range   Glucose-Capillary 259 (H) 65 - 99 mg/dL  CBG monitoring, ED     Status: Abnormal   Collection Time: 02/27/18  5:24 PM  Result Value Ref Range   Glucose-Capillary 194 (H) 65 - 99 mg/dL   Comment 1 Notify RN    Comment 2 Document in Chart   CBG monitoring, ED     Status: Abnormal   Collection Time: 02/27/18  6:41 PM  Result Value Ref Range   Glucose-Capillary 176 (H) 65 - 99 mg/dL  CBG monitoring, ED     Status: Abnormal   Collection Time: 02/27/18  7:46 PM  Result Value Ref Range   Glucose-Capillary 189 (H) 65 - 99 mg/dL  Basic metabolic panel     Status: Abnormal   Collection Time: 02/27/18  7:47 PM  Result Value Ref Range   Sodium 140 135 - 145 mmol/L   Potassium 4.2 3.5 - 5.1 mmol/L   Chloride 117 (H) 101 - 111 mmol/L   CO2 13 (L) 22 - 32 mmol/L   Glucose, Bld 186 (H) 65 - 99 mg/dL   BUN 6 6 - 20 mg/dL   Creatinine, Ser 0.95 0.44 - 1.00 mg/dL   Calcium 8.1 (L) 8.9 - 10.3 mg/dL   GFR calc non Af Amer >60 >60 mL/min   GFR calc  Af Amer >60 >60 mL/min    Comment: (NOTE) The eGFR has been calculated using the CKD EPI equation. This calculation has not been validated in all clinical situations. eGFR's persistently <60 mL/min signify possible Chronic Kidney Disease.    Anion gap 10 5 - 15    Comment: Performed at [facility], [place] [address] [place], [place]  [address]  CBC     Status: Abnormal   Collection Time: 02/27/18  7:47 PM  Result Value Ref Range   WBC 9.7 4.0 - 10.5 K/uL   RBC 4.11 3.87 - 5.11 MIL/uL   Hemoglobin 9.5 (L) 12.0 - 15.0 g/dL   HCT 31.0 (L) 36.0 - 46.0 %   MCV 75.4 (L) 78.0 - 100.0 fL   MCH 23.1 (L) 26.0 - 34.0 pg   MCHC 30.6 30.0 - 36.0 g/dL   RDW 17.5 (H) 11.5 - 15.5 %   Platelets 290 150 - 400 K/uL    Comment: Performed at [facility], [place] [address] [place], [place] [address]  Creatinine, serum     Status: None   Collection Time: 02/27/18  7:47 PM  Result Value Ref Range   Creatinine, Ser 0.88 0.44 - 1.00 mg/dL   GFR calc non Af Amer >60 >60 mL/min   GFR calc Af Amer >60 >60 mL/min    Comment: (NOTE) The eGFR has been calculated using the CKD EPI equation. This calculation has not been validated in all clinical situations. eGFR's persistently <60 mL/min signify possible Chronic Kidney Disease. Performed at [facility], [place] [address], [place], [place] [address]    No results Davenport.  Pending Labs Unresulted Labs (From admission, onward)   Start     Ordered   03/06/18 0500  Creatinine, serum  (enoxaparin (LOVENOX)    CrCl >/= 30 ml/min)  Weekly,   R    Comments:  while on enoxaparin therapy    02/27/18 1556   02/27/18 2202  Basic metabolic panel  STAT Now then every 4 hours ,   STAT     02/27/18 1556   02/27/18 1555  HIV antibody (Routine Testing)  Once,   R     02/27/18 1556      Vitals/Pain Today's Vitals   02/27/18 1755 02/27/18 1830 02/27/18 1831 02/27/18 2000  BP: 133/77 127/79 127/79 127/69  Pulse: 89 94 99 96  Resp: (!) 23 20 20  (!) 22  Temp:      TempSrc:      SpO2: 100% 100% 100% 100%  Weight:      Height:      PainSc:        Isolation Precautions No active isolations  Medications Medications  0.9 %  sodium chloride infusion ( Intravenous New Bag/Given 02/27/18 1628)  dextrose 5 %-0.45 % sodium chloride infusion ( Intravenous New Bag/Given  02/27/18 1808)  insulin regular (NOVOLIN R,HUMULIN R) 100 Units in sodium chloride 0.9 % 100 mL (1 Units/mL) infusion (5.2 Units/hr Intravenous Rate/Dose Change 02/27/18 1953)  enoxaparin (LOVENOX) injection 40 mg (has no administration in time range)  sodium chloride 0.9 % bolus 1,000 mL (1,000 mLs Intravenous Given 02/27/18 1458)  potassium chloride 10 mEq in 100 mL IVPB (0 mEq Intravenous Stopped 02/27/18 1944)    Mobility walks

## 2018-02-28 ENCOUNTER — Observation Stay (HOSPITAL_COMMUNITY): Payer: Self-pay

## 2018-02-28 DIAGNOSIS — E119 Type 2 diabetes mellitus without complications: Secondary | ICD-10-CM

## 2018-02-28 LAB — BASIC METABOLIC PANEL
ANION GAP: 6 (ref 5–15)
ANION GAP: 8 (ref 5–15)
CALCIUM: 7.9 mg/dL — AB (ref 8.9–10.3)
CO2: 15 mmol/L — ABNORMAL LOW (ref 22–32)
CO2: 18 mmol/L — ABNORMAL LOW (ref 22–32)
CREATININE: 0.69 mg/dL (ref 0.44–1.00)
Calcium: 7.9 mg/dL — ABNORMAL LOW (ref 8.9–10.3)
Chloride: 116 mmol/L — ABNORMAL HIGH (ref 101–111)
Chloride: 114 mmol/L — ABNORMAL HIGH (ref 101–111)
Creatinine, Ser: 0.8 mg/dL (ref 0.44–1.00)
GFR calc Af Amer: >60 mL/min (ref >60)
Glucose, Bld: 189 mg/dL — ABNORMAL HIGH (ref 65–99)
Glucose, Bld: 151 mg/dL — ABNORMAL HIGH (ref 65–99)
POTASSIUM: 3.5 mmol/L (ref 3.5–5.1)
Potassium: 3.4 mmol/L — ABNORMAL LOW (ref 3.5–5.1)
SODIUM: 139 mmol/L (ref 135–145)
Sodium: 138 mmol/L (ref 135–145)

## 2018-02-28 LAB — CBC WITH DIFFERENTIAL/PLATELET
BASOS ABS: 0 10*3/uL (ref 0.0–0.1)
BASOS PCT: 0 %
EOS ABS: 0.1 10*3/uL (ref 0.0–0.7)
Eosinophils Relative: 1 %
HCT: 31.5 % — ABNORMAL LOW (ref 36.0–46.0)
Hemoglobin: 9.6 g/dL — ABNORMAL LOW (ref 12.0–15.0)
LYMPHS ABS: 2.7 10*3/uL (ref 0.7–4.0)
LYMPHS PCT: 39 %
MCH: 22.7 pg — AB (ref 26.0–34.0)
MCHC: 30.5 g/dL (ref 30.0–36.0)
MCV: 74.6 fL — ABNORMAL LOW (ref 78.0–100.0)
MONO ABS: 0.4 10*3/uL (ref 0.1–1.0)
Monocytes Relative: 6 %
NEUTROS ABS: 3.8 10*3/uL (ref 1.7–7.7)
Neutrophils Relative %: 54 %
PLATELETS: 291 10*3/uL (ref 150–400)
RBC: 4.22 MIL/uL (ref 3.87–5.11)
RDW: 17.8 % — AB (ref 11.5–15.5)
WBC: 7 10*3/uL (ref 4.0–10.5)

## 2018-02-28 LAB — GLUCOSE, CAPILLARY
GLUCOSE-CAPILLARY: 148 mg/dL — AB (ref 65–99)
GLUCOSE-CAPILLARY: 163 mg/dL — AB (ref 65–99)
GLUCOSE-CAPILLARY: 166 mg/dL — AB (ref 65–99)
GLUCOSE-CAPILLARY: 232 mg/dL — AB (ref 65–99)
Glucose-Capillary: 223 mg/dL — ABNORMAL HIGH (ref 65–99)
Glucose-Capillary: 192 mg/dL — ABNORMAL HIGH (ref 65–99)
Glucose-Capillary: 197 mg/dL — ABNORMAL HIGH (ref 65–99)
Glucose-Capillary: 165 mg/dL — ABNORMAL HIGH (ref 65–99)
Glucose-Capillary: 131 mg/dL — ABNORMAL HIGH (ref 65–99)
Glucose-Capillary: 150 mg/dL — ABNORMAL HIGH (ref 65–99)
Glucose-Capillary: 169 mg/dL — ABNORMAL HIGH (ref 65–99)
Glucose-Capillary: 202 mg/dL — ABNORMAL HIGH (ref 65–99)

## 2018-02-28 LAB — HIV ANTIBODY (ROUTINE TESTING W REFLEX): HIV Screen 4th Generation wRfx: NONREACTIVE

## 2018-02-28 LAB — HEMOGLOBIN A1C
HEMOGLOBIN A1C: 9.7 % — AB (ref 4.8–5.6)
MEAN PLASMA GLUCOSE: 231.69 mg/dL

## 2018-02-28 MED ORDER — ONDANSETRON HCL 4 MG/2ML IJ SOLN
4.0000 mg | Freq: Once | INTRAMUSCULAR | Status: AC
  Administered 2018-02-28: 4 mg via INTRAVENOUS
  Filled 2018-02-28: qty 2

## 2018-02-28 MED ORDER — ACETAMINOPHEN 325 MG PO TABS
650.0000 mg | ORAL_TABLET | Freq: Four times a day (QID) | ORAL | Status: DC | PRN
  Administered 2018-02-28 (×2): 650 mg via ORAL
  Filled 2018-02-28 (×2): qty 2

## 2018-02-28 MED ORDER — LIVING WELL WITH DIABETES BOOK: 1.0000 | Freq: Once | Status: AC

## 2018-02-28 MED ORDER — INSULIN ASPART 100 UNIT/ML ~~LOC~~ SOLN
0.0000 [IU] | SUBCUTANEOUS | Status: DC
  Administered 2018-02-28 (×2): 5 [IU] via SUBCUTANEOUS

## 2018-02-28 MED ORDER — POTASSIUM CHLORIDE 10 MEQ/100ML IV SOLN
10.0000 meq | INTRAVENOUS | Status: AC
  Administered 2018-02-28 (×4): 10 meq via INTRAVENOUS
  Filled 2018-02-28 (×4): qty 100

## 2018-02-28 MED ORDER — INSULIN GLARGINE 100 UNIT/ML ~~LOC~~ SOLN
10.0000 [IU] | Freq: Every day | SUBCUTANEOUS | Status: DC
  Administered 2018-02-28: 10 [IU] via SUBCUTANEOUS
  Filled 2018-02-28: qty 0.1

## 2018-02-28 MED ORDER — INSULIN STARTER KIT- SYRINGES (ENGLISH): 1.0000 | Freq: Once | 0 refills | Status: AC

## 2018-02-28 MED ORDER — POTASSIUM CHLORIDE CRYS ER 20 MEQ PO TBCR
40.0000 meq | EXTENDED_RELEASE_TABLET | Freq: Once | ORAL | Status: AC
  Administered 2018-02-28: 40 meq via ORAL
  Filled 2018-02-28: qty 2

## 2018-02-28 MED ORDER — INSULIN NPH ISOPHANE & REGULAR (70-30) 100 UNIT/ML ~~LOC~~ SUSP: 10.0000 [IU] | Freq: Two times a day (BID) | SUBCUTANEOUS | 1 refills | Status: DC

## 2018-02-28 MED ORDER — INSULIN STARTER KIT- SYRINGES (ENGLISH)
1.0000 | Freq: Once | Status: AC
  Administered 2018-02-28: 1
  Filled 2018-02-28: qty 1

## 2018-02-28 MED ORDER — SODIUM CHLORIDE 0.9 % IV BOLUS
2000.0000 mL | Freq: Once | INTRAVENOUS | Status: AC
  Administered 2018-02-28: 2000 mL via INTRAVENOUS

## 2018-02-28 MED ORDER — LIVING WELL WITH DIABETES BOOK
Freq: Once | Status: AC
  Administered 2018-02-28: 18:00:00
  Filled 2018-02-28 (×2): qty 1

## 2018-02-28 MED ORDER — METFORMIN HCL 500 MG PO TABS: 500.0000 mg | ORAL_TABLET | Freq: Two times a day (BID) | ORAL | 0 refills | Status: DC

## 2018-02-28 MED ORDER — BLOOD GLUCOSE METER KIT: PACK | 0 refills | Status: AC

## 2018-02-28 MED ORDER — ACETAMINOPHEN 500 MG PO TABS: 1000.0000 mg | ORAL_TABLET | Freq: Four times a day (QID) | ORAL | Status: DC | PRN

## 2018-02-28 NOTE — Discharge Summary (Signed)
Physician Discharge Summary  Dana Davenport  VHQ:[id number]  DOB: [birth year]  DOA: 02/27/2018 PCP: Patient, No Pcp Per  Admit date: 02/27/2018 Discharge date: 02/28/2018  Admitted From: Home  Disposition:  Home   Recommendations for Outpatient Follow-up:  1. Please obtain BMP/CBC in one week to monitor Hgb and Renal function   Discharge Condition: Stable  CODE STATUS: Full Code  Diet recommendation: Heart Healthy/carb modified  Brief/Interim Summary: For full details see H&P/Progress note, but in brief, Dana Davenport is a 27 year old female  with no significant medical history presented to the emergency department complaining of nausea, vomiting, and abdominal pain.  All other associated symptoms include polydipsia and polyuria.  Upon ED evaluation she was found to be dehydrated, lab workup revealed elevated glucose at 355, creatinine 1.05 and anion gap of 17.  Patient was admitted  with working diagnosis of new diagnosis diabetes mellitus with DKA.  A1c was 9.7.  Patient was treated with insulin drip subsequently transitioned to subcutaneous insulin with Lantus.  Patient was started on carb modified diet and tolerated well.  Blood glucose remained stable.  Patient did not have any signs or symptoms of infection, urinalysis and chest x-rays were negative.  DKA likely from viral gastroenteritis.  Patient clinical improve and deemed stable for discharge.  She was discharged on insulin  Novolin 70/30, metformin 500 mg twice daily and was advised to establish care with the [facility] for follow-up.  Subjective: Patient seen and examined, feeling better.  Today complaining of headache.  Tylenol helping.  No other concerns.  Afebrile.  Discharge Diagnoses/Hospital Course:  DKA - new diagnosis of diabetes mellitus ? Type 1.5 vs type 2 A1c 9.7.  Patient initially treated with insulin drip, successfully transitioned to subcutaneous insulin anion gap has been closed.  Patient  tolerating diet well.  Patient will be discharged on metformin 500 mg twice daily and Novolin 70/30.  Patient was educated on insulin use. Glucose meter kit was prescribed.  Patient counseled on diet and lifestyle modification.  Morbid obesity Outpatient follow recommended. Weight loss program   On the day of the discharge the patient's vitals were stable, and no other acute medical condition were reported by patient. the patient was felt safe to be discharge to home.   Discharge Instructions  You were cared for by a hospitalist during your hospital stay. If you have any questions about your discharge medications or the care you received while you were in the hospital after you are discharged, you can call the unit and asked to speak with the hospitalist on call if the hospitalist that took care of you is not available. Once you are discharged, your primary care physician will handle any further medical issues. Please note that NO REFILLS for any discharge medications will be authorized once you are discharged, as it is imperative that you return to your primary care physician (or establish a relationship with a primary care physician if you do not have one) for your aftercare needs so that they can reassess your need for medications and monitor your lab values.  Discharge Instructions    Call MD for:  difficulty breathing, headache or visual disturbances   Complete by:  As directed    Call MD for:  extreme fatigue   Complete by:  As directed    Call MD for:  hives   Complete by:  As directed    Call MD for:  persistant dizziness or light-headedness   Complete by:  As directed  Call MD for:  persistant nausea and vomiting   Complete by:  As directed    Call MD for:  redness, tenderness, or signs of infection (pain, swelling, redness, odor or green/yellow discharge around incision site)   Complete by:  As directed    Call MD for:  severe uncontrolled pain   Complete by:  As directed     Call MD for:  temperature >100.4   Complete by:  As directed    Diet Carb Modified   Complete by:  As directed    Discharge instructions   Complete by:  As directed    Call [facility] for appointment   Increase activity slowly   Complete by:  As directed      Allergies as of 02/28/2018   No Known Allergies     Medication List    STOP taking these medications   benzonatate 100 MG capsule Commonly known as:  TESSALON   ibuprofen 600 MG tablet Commonly known as:  ADVIL,MOTRIN   ondansetron 4 MG disintegrating tablet Commonly known as:  ZOFRAN ODT     TAKE these medications   blood glucose meter kit and supplies Dispense based on patient and insurance preference. Use up to four times daily as directed. (FOR ICD-9 250.00, 250.01).   insulin NPH-regular Human (70-30) 100 UNIT/ML injection Commonly known as:  NOVOLIN 70/30 Inject 10 Units into the skin 2 (two) times daily with a meal.   insulin starter kit- syringes Misc 1 kit by Other route once for 1 dose.   living well with diabetes book Misc 1 each by Does not apply route once for 1 dose.   metFORMIN 500 MG tablet Commonly known as:  GLUCOPHAGE Take 1 tablet (500 mg total) by mouth 2 (two) times daily with a meal.      Follow-up Information    [facility]. Schedule an appointment as soon as possible for a visit.   Why:  call in am for pcp appt. Contact information: [place] [address] [phone]         No Known Allergies  Consultations:  None  Procedures/Studies: Dg Chest Port 1 View  Result Date: 02/28/2018 CLINICAL DATA:  Cough and congestion EXAM: PORTABLE CHEST 1 VIEW COMPARISON:  01/24/2018 FINDINGS: Cardiac shadow is within normal limits. The lungs are well aerated bilaterally. No focal infiltrate or sizable effusion is seen. No bony abnormality is noted. IMPRESSION: No active disease. Electronically Signed   By:  Inez Catalina M.D.   On: 02/28/2018 09:11     Discharge Exam: Vitals:   02/28/18 1500 02/28/18 1600  BP: 126/72 (!) 122/56  Pulse: 84 84  Resp: 15 19  Temp:    SpO2: 99% 100%   Vitals:   02/28/18 1300 02/28/18 1400 02/28/18 1500 02/28/18 1600  BP: (!) 108/56 124/77 126/72 (!) 122/56  Pulse: 80 73 84 84  Resp: 18 (!) 22 15 19   Temp:      TempSrc:      SpO2: 100% 99% 99% 100%  Weight:      Height:        General: NAD Cardiovascular: RRR, S1/S2 + Respiratory: CTA bilaterally, no wheezing, no rhonchi Abdominal: Soft, NT, ND, bowel sounds + Extremities: no edema.   The results of significant diagnostics from this hospitalization (including imaging, microbiology, ancillary and laboratory) are listed below for reference.     Microbiology: Recent Results (from the past 240 hour(s))  MRSA PCR Screening     Status: Abnormal   Collection Time: 02/27/18  9:36 PM  Result Value Ref Range Status   MRSA by PCR POSITIVE (A) NEGATIVE Final    Comment:        The GeneXpert MRSA Assay (FDA approved for NASAL specimens only), is one component of a comprehensive MRSA colonization surveillance program. It is not intended to diagnose MRSA infection nor to guide or monitor treatment for MRSA infections. RESULT CALLED TO, READ BACK BY AND VERIFIED WITHLoletha Grayer MERRITT RN 2307 02/27/18 A NAVARRO Performed at [facility], [place] [address], [place], [place] [address]     Labs: BNP (last 3 results) No results for input(s): BNP in the last 8760 hours. Basic Metabolic Panel: Recent Labs  Lab 02/27/18 1346 02/27/18 1947 02/28/18 0041 02/28/18 0814  NA 134* 140 139 138  K 3.9 4.2 3.5 3.4*  CL 102 117* 116* 114*  CO2 15* 13* 15* 18*  GLUCOSE 355* 186* 189* 151*  BUN 7 6 <5* <5*  CREATININE 1.04* 0.95  0.88 0.80 0.69  CALCIUM 9.2 8.1* 7.9* 7.9*   Liver Function Tests: No results for input(s): AST, ALT, ALKPHOS, BILITOT, PROT, ALBUMIN in the last 168 hours. No  results for input(s): LIPASE, AMYLASE in the last 168 hours. No results for input(s): AMMONIA in the last 168 hours. CBC: Recent Labs  Lab 02/27/18 1346 02/27/18 1947 02/28/18 0814  WBC 9.9 9.7 7.0  NEUTROABS  --   --  3.8  HGB 11.2* 9.5* 9.6*  HCT 37.0 31.0* 31.5*  MCV 75.4* 75.4* 74.6*  PLT 367 290 291   Cardiac Enzymes: No results for input(s): CKTOTAL, CKMB, CKMBINDEX, TROPONINI in the last 168 hours. BNP: Invalid input(s): POCBNP CBG: Recent Labs  Lab 02/28/18 0732 02/28/18 0834 02/28/18 0952 02/28/18 1209 02/28/18 1618  GLUCAP 131* 150* 169* 202* 232*   D-Dimer No results for input(s): DDIMER in the last 72 hours. Hgb A1c Recent Labs    02/28/18 0814  HGBA1C 9.7*   Lipid Profile No results for input(s): CHOL, HDL, LDLCALC, TRIG, CHOLHDL, LDLDIRECT in the last 72 hours. Thyroid function studies No results for input(s): TSH, T4TOTAL, T3FREE, THYROIDAB in the last 72 hours.  Invalid input(s): FREET3 Anemia work up No results for input(s): VITAMINB12, FOLATE, FERRITIN, TIBC, IRON, RETICCTPCT in the last 72 hours. Urinalysis    Component Value Date/Time   COLORURINE STRAW (A) 02/27/2018 1612   APPEARANCEUR CLEAR 02/27/2018 1612   LABSPEC 1.027 02/27/2018 1612   PHURINE 5.0 02/27/2018 1612   GLUCOSEU >=500 (A) 02/27/2018 1612   HGBUR NEGATIVE 02/27/2018 1612   BILIRUBINUR NEGATIVE 02/27/2018 1612   KETONESUR 80 (A) 02/27/2018 1612   PROTEINUR 30 (A) 02/27/2018 1612   UROBILINOGEN 0.2 01/24/2012 2110   NITRITE NEGATIVE 02/27/2018 1612   LEUKOCYTESUR NEGATIVE 02/27/2018 1612   Sepsis Labs Invalid input(s): PROCALCITONIN,  WBC,  LACTICIDVEN Microbiology Recent Results (from the past 240 hour(s))  MRSA PCR Screening     Status: Abnormal   Collection Time: 02/27/18  9:36 PM  Result Value Ref Range Status   MRSA by PCR POSITIVE (A) NEGATIVE Final    Comment:        The GeneXpert MRSA Assay (FDA approved for NASAL specimens only), is one component of  a comprehensive MRSA colonization surveillance program. It is not intended to diagnose MRSA infection nor to guide or monitor treatment for MRSA infections. RESULT CALLED TO, READ BACK BY AND VERIFIED WITH: C MERRITT  RN 2307 02/27/18 A NAVARRO Performed at [facility], [place] [address] [place], [place] [address]      Time coordinating discharge: 25 minutes  SIGNED:  Chipper Oman, MD  [facility] 02/28/2018, 4:58 PM  Pager please text page via  [email or web address]  Note - This record has been created using [employer]. Chart creation errors have been sought, but may not always have been located. Such creation errors do not reflect on the standard of medical care.

## 2018-02-28 NOTE — Care Management Note (Signed)
Case Management Note  Patient Details  Name: Philis FendtShareal Pfeifer MRN: [id number] Date of Birth: [birth year]  Subjective/Objective: 27 y/o f admitted w/DKA. From home. No PCP-provided w/[facility] for pcp-patient will call in am to make appt.. [facility] $4-70/30 novolog insulin(able to afford). Patient voiced understanding. No further CM needs.                   Action/Plan:d/c home.   Expected Discharge Date:  (unknown)               Expected Discharge Plan:  Home/Self Care  In-House Referral:     Discharge planning Services  CM Consult, [facility]  Post Acute Care Choice:    Choice offered to:     DME Arranged:    DME Agency:     HH Arranged:    HH Agency:     Status of Service:  Completed, signed off  If discussed at [employer] Length of Stay Meetings, dates discussed:    Additional Comments:  Lanier ClamMahabir, Mikah Rottinghaus, RN 02/28/2018, 4:16 PM

## 2018-02-28 NOTE — Progress Notes (Signed)
02/28/18  1800  Reviewed discharge instructions with patient. Patient verbalized understanding of discharge instructions. Copy of discharge instructions and prescriptions given to patient. Reviewed insulin kit and living well diabetes booklet with patient. Encouraged patient to check blood sugars four times a day ( before each meal and at bed time), getting appointment at the community wellness center as soon as possible, writing down all blood sugars in booklet provided in kit and take with her to see doctor. Reviewed how to draw up insulin and how to give injection. Patient returned demonstration of how to draw up insulin.

## 2018-02-28 NOTE — Progress Notes (Signed)
Inpatient Diabetes Program Recommendations  AACE/ADA: New Consensus Statement on Inpatient Glycemic Control (2015)  Target Ranges:  Prepandial:   less than 140 mg/dL      Peak postprandial:   less than 180 mg/dL (1-2 hours)      Critically ill patients:  140 - 180 mg/dL   Lab Results  Component Value Date   GLUCAP 232 (H) 02/28/2018   HGBA1C 9.7 (H) 02/28/2018    Review of Glycemic Control  Diabetes history: New-onset Outpatient Diabetes medications: None Current orders for Inpatient glycemic control: Lantus 10 units QD, Novolog 0-15 units Q4H HgbA1C of 9.7% indicates DM diagnosis.  Inpatient Diabetes Program Recommendations:    To be d/ced on 70/30 10 units bid Glucose meter and supplies  Ordered Living Well book and insulin starter kit. Pt to f/u with [facility]. Insulin from [facility] - $25/vial RN to teach insulin administration. Discussed patho, monitoring, diet and exercise and importance of good glucose control Stressed importance of weight loss.  Answered questions.  Discussed hypoglycemia s/s and treatment. Pt voiced understanding. RN to educate on insulin administration.  Thank you. Lorenda Peck, RD, LDN, CDE Inpatient Diabetes Coordinator [phone]

## 2018-02-28 NOTE — Plan of Care (Signed)
  Problem: Activity: Goal: Risk for activity intolerance will decrease Outcome: Progressing   Problem: Nutrition: Goal: Adequate nutrition will be maintained Outcome: Progressing   Problem: Pain Managment: Goal: General experience of comfort will improve Outcome: Progressing   

## 2018-03-01 ENCOUNTER — Telehealth: Payer: Self-pay | Admitting: Emergency Medicine

## 2018-03-01 ENCOUNTER — Telehealth: Payer: Self-pay | Admitting: *Deleted

## 2018-03-01 NOTE — Telephone Encounter (Signed)
Called pt to f/u on how she was doing with injecting insulin and checking blood sugars. [facility] - new diagnosis of DM and new to insulin. Pt states she is taking 70/30 10 units bid. Blood sugars are running in 300s and she has appt with [facility] for next week. [facility] nurse regarding blood sugars in 300s.Pt states she will. [facility] if she cannot get through on telephone.   Pt to call [facility] Inpatient Diabetes office in am to update.  Thank you. Dana Davenport, RD, LDN, CDE Inpatient Diabetes Coordinator [phone] (pager)

## 2018-03-06 ENCOUNTER — Telehealth: Payer: Self-pay

## 2018-03-06 ENCOUNTER — Inpatient Hospital Stay (HOSPITAL_COMMUNITY)
Admission: EM | Admit: 2018-03-06 | Discharge: 2018-03-08 | DRG: 639 | Disposition: A | Discharge status: Home / Self Care | Payer: Self-pay | Attending: Internal Medicine | Admitting: Internal Medicine

## 2018-03-06 ENCOUNTER — Encounter (HOSPITAL_COMMUNITY): Payer: Self-pay

## 2018-03-06 ENCOUNTER — Emergency Department (HOSPITAL_COMMUNITY): Payer: Self-pay

## 2018-03-06 ENCOUNTER — Other Ambulatory Visit: Payer: Self-pay

## 2018-03-06 DIAGNOSIS — E669 Obesity, unspecified: Secondary | ICD-10-CM | POA: Diagnosis present

## 2018-03-06 DIAGNOSIS — Z6837 Body mass index (BMI) 37.0-37.9, adult: Secondary | ICD-10-CM

## 2018-03-06 DIAGNOSIS — E876 Hypokalemia: Secondary | ICD-10-CM | POA: Diagnosis present

## 2018-03-06 DIAGNOSIS — E1110 Type 2 diabetes mellitus with ketoacidosis without coma: Principal | ICD-10-CM | POA: Diagnosis present

## 2018-03-06 DIAGNOSIS — R739 Hyperglycemia, unspecified: Secondary | ICD-10-CM

## 2018-03-06 DIAGNOSIS — Z87891 Personal history of nicotine dependence: Secondary | ICD-10-CM

## 2018-03-06 DIAGNOSIS — Z794 Long term (current) use of insulin: Secondary | ICD-10-CM

## 2018-03-06 DIAGNOSIS — D509 Iron deficiency anemia, unspecified: Secondary | ICD-10-CM | POA: Diagnosis present

## 2018-03-06 DIAGNOSIS — E0865 Diabetes mellitus due to underlying condition with hyperglycemia: Secondary | ICD-10-CM

## 2018-03-06 HISTORY — DX: Type 2 diabetes mellitus without complications: E11.9

## 2018-03-06 LAB — CBC WITH DIFFERENTIAL/PLATELET
BASOS PCT: 0 %
Basophils Absolute: 0 10*3/uL (ref 0.0–0.1)
Eosinophils Absolute: 0.1 10*3/uL (ref 0.0–0.7)
Eosinophils Relative: 2 %
HEMATOCRIT: 33.9 % — AB (ref 36.0–46.0)
HEMOGLOBIN: 10.3 g/dL — AB (ref 12.0–15.0)
Lymphocytes Relative: 41 %
Lymphs Abs: 2.1 10*3/uL (ref 0.7–4.0)
MCH: 23.3 pg — ABNORMAL LOW (ref 26.0–34.0)
MCHC: 30.4 g/dL (ref 30.0–36.0)
MCV: 76.5 fL — ABNORMAL LOW (ref 78.0–100.0)
Monocytes Absolute: 0.4 10*3/uL (ref 0.1–1.0)
Monocytes Relative: 8 %
NEUTROS ABS: 2.6 10*3/uL (ref 1.7–7.7)
NEUTROS PCT: 49 %
Platelets: 353 10*3/uL (ref 150–400)
RBC: 4.43 MIL/uL (ref 3.87–5.11)
RDW: 19.1 % — ABNORMAL HIGH (ref 11.5–15.5)
WBC: 5.3 10*3/uL (ref 4.0–10.5)

## 2018-03-06 LAB — BASIC METABOLIC PANEL WITH GFR
Anion gap: 7 (ref 5–15)
BUN: 7 mg/dL (ref 6–20)
CO2: 25 mmol/L (ref 22–32)
Calcium: 8.6 mg/dL — ABNORMAL LOW (ref 8.9–10.3)
Chloride: 106 mmol/L (ref 101–111)
Creatinine, Ser: 0.78 mg/dL (ref 0.44–1.00)
GFR calc Af Amer: >60 mL/min
GFR calc non Af Amer: >60 mL/min
Glucose, Bld: 206 mg/dL — ABNORMAL HIGH (ref 65–99)
Potassium: 3.3 mmol/L — ABNORMAL LOW (ref 3.5–5.1)
Sodium: 138 mmol/L (ref 135–145)

## 2018-03-06 LAB — I-STAT BETA HCG BLOOD, ED (MC, WL, AP ONLY)

## 2018-03-06 LAB — CBG MONITORING, ED
GLUCOSE-CAPILLARY: 392 mg/dL — AB (ref 65–99)
Glucose-Capillary: 443 mg/dL — ABNORMAL HIGH (ref 65–99)
Glucose-Capillary: 348 mg/dL — ABNORMAL HIGH (ref 65–99)
Glucose-Capillary: 299 mg/dL — ABNORMAL HIGH (ref 65–99)

## 2018-03-06 LAB — COMPREHENSIVE METABOLIC PANEL
ALBUMIN: 3.8 g/dL (ref 3.5–5.0)
ALT: 92 U/L — ABNORMAL HIGH (ref 14–54)
ANION GAP: 11 (ref 5–15)
AST: 54 U/L — ABNORMAL HIGH (ref 15–41)
Alkaline Phosphatase: 75 U/L (ref 38–126)
BILIRUBIN TOTAL: 0.4 mg/dL (ref 0.3–1.2)
BUN: 5 mg/dL — ABNORMAL LOW (ref 6–20)
CHLORIDE: 99 mmol/L — AB (ref 101–111)
CO2: 25 mmol/L (ref 22–32)
Calcium: 9.2 mg/dL (ref 8.9–10.3)
Creatinine, Ser: 0.78 mg/dL (ref 0.44–1.00)
GFR calc Af Amer: >60 mL/min (ref >60)
GFR calc non Af Amer: >60 mL/min (ref >60)
GLUCOSE: 481 mg/dL — AB (ref 65–99)
POTASSIUM: 3.5 mmol/L (ref 3.5–5.1)
Sodium: 135 mmol/L (ref 135–145)
TOTAL PROTEIN: 6.9 g/dL (ref 6.5–8.1)

## 2018-03-06 LAB — URINALYSIS, ROUTINE W REFLEX MICROSCOPIC
BACTERIA UA: NONE SEEN
BILIRUBIN URINE: NEGATIVE
Glucose, UA: >=500 mg/dL — AB
Ketones, ur: NEGATIVE mg/dL
Leukocytes, UA: NEGATIVE
NITRITE: NEGATIVE
Protein, ur: NEGATIVE mg/dL
SPECIFIC GRAVITY, URINE: 1.018 (ref 1.005–1.030)
pH: 7 (ref 5.0–8.0)

## 2018-03-06 LAB — GLUCOSE, CAPILLARY
GLUCOSE-CAPILLARY: 272 mg/dL — AB (ref 65–99)
Glucose-Capillary: 190 mg/dL — ABNORMAL HIGH (ref 65–99)
Glucose-Capillary: 277 mg/dL — ABNORMAL HIGH (ref 65–99)
Glucose-Capillary: 209 mg/dL — ABNORMAL HIGH (ref 65–99)
Glucose-Capillary: 238 mg/dL — ABNORMAL HIGH (ref 65–99)
Glucose-Capillary: 201 mg/dL — ABNORMAL HIGH (ref 65–99)
Glucose-Capillary: 178 mg/dL — ABNORMAL HIGH (ref 65–99)
Glucose-Capillary: 224 mg/dL — ABNORMAL HIGH (ref 65–99)
Glucose-Capillary: 210 mg/dL — ABNORMAL HIGH (ref 65–99)

## 2018-03-06 LAB — BASIC METABOLIC PANEL
Anion gap: 6 (ref 5–15)
BUN: 5 mg/dL — ABNORMAL LOW (ref 6–20)
CO2: 27 mmol/L (ref 22–32)
CREATININE: 0.74 mg/dL (ref 0.44–1.00)
Calcium: 8.7 mg/dL — ABNORMAL LOW (ref 8.9–10.3)
Chloride: 106 mmol/L (ref 101–111)
Glucose, Bld: 195 mg/dL — ABNORMAL HIGH (ref 65–99)
Potassium: 3.1 mmol/L — ABNORMAL LOW (ref 3.5–5.1)
SODIUM: 139 mmol/L (ref 135–145)

## 2018-03-06 LAB — LIPASE, BLOOD: Lipase: 27 U/L (ref 11–51)

## 2018-03-06 LAB — MRSA PCR SCREENING: MRSA by PCR: NEGATIVE

## 2018-03-06 MED ORDER — BUTALBITAL-APAP-CAFFEINE 50-325-40 MG PO TABS
2.0000 | ORAL_TABLET | Freq: Once | ORAL | Status: AC
  Administered 2018-03-06: 2 via ORAL
  Filled 2018-03-06: qty 2

## 2018-03-06 MED ORDER — DEXTROSE-NACL 5-0.45 % IV SOLN: INTRAVENOUS | Status: DC

## 2018-03-06 MED ORDER — DEXTROSE-NACL 5-0.45 % IV SOLN
INTRAVENOUS | Status: DC
  Administered 2018-03-06 – 2018-03-07 (×2): via INTRAVENOUS

## 2018-03-06 MED ORDER — SODIUM CHLORIDE 0.9 % IV SOLN
INTRAVENOUS | Status: DC
Start: 2018-03-06 — End: 2018-03-07

## 2018-03-06 MED ORDER — ENOXAPARIN SODIUM 40 MG/0.4ML ~~LOC~~ SOLN
40.0000 mg | SUBCUTANEOUS | Status: DC
  Administered 2018-03-06 – 2018-03-07 (×2): 40 mg via SUBCUTANEOUS
  Filled 2018-03-06 (×3): qty 0.4

## 2018-03-06 MED ORDER — POTASSIUM CHLORIDE 10 MEQ/100ML IV SOLN
10.0000 meq | INTRAVENOUS | Status: AC
  Administered 2018-03-06: 10 meq via INTRAVENOUS
  Filled 2018-03-06: qty 100

## 2018-03-06 MED ORDER — SODIUM CHLORIDE 0.9 % IV SOLN
INTRAVENOUS | Status: DC
  Administered 2018-03-06 – 2018-03-07 (×2): via INTRAVENOUS

## 2018-03-06 MED ORDER — SODIUM CHLORIDE 0.9 % IV SOLN
INTRAVENOUS | Status: DC
  Administered 2018-03-06: 3.3 [IU]/h via INTRAVENOUS
  Filled 2018-03-06: qty 1

## 2018-03-06 MED ORDER — SODIUM CHLORIDE 0.9 % IV SOLN: INTRAVENOUS | Status: AC

## 2018-03-06 MED ORDER — POTASSIUM CHLORIDE 10 MEQ/100ML IV SOLN
10.0000 meq | INTRAVENOUS | Status: AC
  Filled 2018-03-06: qty 100

## 2018-03-06 MED ORDER — SODIUM CHLORIDE 0.9 % IV SOLN
INTRAVENOUS | Status: DC
  Administered 2018-03-06: 4.4 [IU]/h via INTRAVENOUS
  Administered 2018-03-06: 2.8 [IU]/h via INTRAVENOUS
  Administered 2018-03-07: 4.9 [IU]/h via INTRAVENOUS
  Filled 2018-03-06 (×2): qty 1

## 2018-03-06 MED ORDER — SODIUM CHLORIDE 0.9 % IV BOLUS
1000.0000 mL | Freq: Once | INTRAVENOUS | Status: AC
  Administered 2018-03-06: 1000 mL via INTRAVENOUS

## 2018-03-06 MED ORDER — SODIUM CHLORIDE 0.9 % IV SOLN: INTRAVENOUS | Status: DC

## 2018-03-06 MED ORDER — POTASSIUM CHLORIDE 10 MEQ/100ML IV SOLN
10.0000 meq | INTRAVENOUS | Status: AC
  Administered 2018-03-06 (×2): 10 meq via INTRAVENOUS
  Filled 2018-03-06: qty 100

## 2018-03-06 NOTE — ED Notes (Signed)
Patient transported to X-ray 

## 2018-03-06 NOTE — Telephone Encounter (Signed)
Message received from Laverda PageAngela Kritzer,RN CM requesting [facility]. Informed her that an appointment has been scheduled for 03/17/18.

## 2018-03-06 NOTE — ED Provider Notes (Signed)
[facility] DEPT Provider Note   CSN: [id number] Arrival date & time: 03/06/18  0855     History   Chief Complaint Chief Complaint  Patient presents with  . Hyperglycemia    HPI Dana Davenport is a 27 y.o. female.  HPI  Pt was seen at [facility]. Per pt, c/o gradual onset and persistence of constant "high blood sugars" for the past 1 week. Pt states her home CBG's have been "500's" since being discharged from the hospital last week for new onset DKA. Pt endorses compliance with SQ insulin and PO metformin. Has been associated with vague, generalized abd "pains." Denies N/V/D, no back pain, no CP/SOB, no fevers, no rash.   Past Medical History:  Diagnosis Date  . Diabetes mellitus without complication [facility])     Patient Active Problem List   Diagnosis Date Noted  . DKA (diabetic ketoacidoses) ([place]) 02/27/2018    Past Surgical History:  Procedure Laterality Date  . NO PAST SURGERIES       OB History    Gravida  0   Para  0   Term  0   Preterm  0   AB  0   Living  0     SAB  0   TAB  0   Ectopic  0   Multiple  0   Live Births               Home Medications    Prior to Admission medications   Medication Sig Start Date End Date Taking? Authorizing Provider  insulin NPH-regular Human (NOVOLIN 70/30) (70-30) 100 UNIT/ML injection Inject 10 Units into the skin 2 (two) times daily with a meal. 02/28/18  Yes Patrecia Pour, Christean Grief, MD  metFORMIN (GLUCOPHAGE) 500 MG tablet Take 1 tablet (500 mg total) by mouth 2 (two) times daily with a meal. 02/28/18 03/30/18 Yes Doreatha Lew, MD  blood glucose meter kit and supplies Dispense based on patient and insurance preference. Use up to four times daily as directed. (FOR ICD-9 250.00, 250.01). 02/28/18   Doreatha Lew, MD    Family History Family History  Problem Relation Age of Onset  . Kidney Stones Mother   . Anesthesia problems Neg Hx     Social History Social History     Tobacco Use  . Smoking status: Former Smoker    Types: Cigarettes  . Smokeless tobacco: Never Used  Substance Use Topics  . Alcohol use: No    Frequency: Never  . Drug use: No     Allergies   Patient has no known allergies.   Review of Systems Review of Systems ROS: Statement: All systems negative except as marked or noted in the HPI; Constitutional: Negative for fever and chills. ; ; Eyes: Negative for eye pain, redness and discharge. ; ; ENMT: Negative for ear pain, hoarseness, nasal congestion, sinus pressure and sore throat. ; ; Cardiovascular: Negative for chest pain, palpitations, diaphoresis, dyspnea and peripheral edema. ; ; Respiratory: Negative for cough, wheezing and stridor. ; ; Gastrointestinal: +abd pain. Negative for nausea, vomiting, diarrhea, blood in stool, hematemesis, jaundice and rectal bleeding. . ; ; Genitourinary: Negative for dysuria, flank pain and hematuria. ; ; Musculoskeletal: Negative for back pain and neck pain. Negative for swelling and trauma.; ; Skin: Negative for pruritus, rash, abrasions, blisters, bruising and skin lesion.; ; Neuro: Negative for headache, lightheadedness and neck stiffness. Negative for weakness, altered level of consciousness, altered mental status, extremity weakness, paresthesias, involuntary  movement, seizure and syncope.       Physical Exam Updated Vital Signs BP 105/85   Pulse 86   Temp 98.2 F (36.8 C) (Oral)   Resp 18   Ht 5' 5"  (1.651 m)   Wt 101.2 kg (223 lb)   LMP 02/28/2018   SpO2 99%   BMI 37.11 kg/m     Patient Vitals for the past 24 hrs:  BP Temp Temp src Pulse Resp SpO2 Height Weight  03/06/18 1030 105/85 - - 86 - 99 % - -  03/06/18 1000 (!) 129/92 - - 77 - 99 % - -  03/06/18 0931 (!) 125/95 - - 76 - 100 % - -  03/06/18 0915 - - - - - - 5' 5"  (1.651 m) 101.2 kg (223 lb)  03/06/18 0913 (!) 120/96 98.2 F (36.8 C) Oral 85 18 100 % - -     Physical Exam 0945: Physical examination:  Nursing notes  reviewed; Vital signs and O2 SAT reviewed;  Constitutional: Well developed, Well nourished, Well hydrated, In no acute distress; Head:  Normocephalic, atraumatic; Eyes: EOMI, PERRL, No scleral icterus; ENMT: Mouth and pharynx normal, Mucous membranes moist; Neck: Supple, Full range of motion, No lymphadenopathy; Cardiovascular: Regular rate and rhythm, No gallop; Respiratory: Breath sounds clear & equal bilaterally, No wheezes.  Speaking full sentences with ease, Normal respiratory effort/excursion; Chest: Nontender, Movement normal; Abdomen: Soft, Nontender, Nondistended, Normal bowel sounds; Genitourinary: No CVA tenderness; Extremities: Peripheral pulses normal, No tenderness, No edema, No calf edema or asymmetry.; Neuro: AA&Ox3, Major CN grossly intact.  Speech clear. No gross focal motor or sensory deficits in extremities.; Skin: Color normal, Warm, Dry.   ED Treatments / Results  Labs (all labs ordered are listed, but only abnormal results are displayed)   EKG None  Radiology   Procedures Procedures (including critical care time)  Medications Ordered in ED Medications  dextrose 5 %-0.45 % sodium chloride infusion (has no administration in time range)  insulin regular (NOVOLIN R,HUMULIN R) 100 Units in sodium chloride 0.9 % 100 mL (1 Units/mL) infusion (has no administration in time range)  0.9 %  sodium chloride infusion (has no administration in time range)  sodium chloride 0.9 % bolus 1,000 mL (0 mLs Intravenous Stopped 03/06/18 1054)     Initial Impression / Assessment and Plan / ED Course  I have reviewed the triage vital signs and the nursing notes.  Pertinent labs & imaging results that were available during my care of the patient were reviewed by me and considered in my medical decision making (see chart for details).  MDM Reviewed: previous chart, nursing note and vitals Reviewed previous: labs Interpretation: labs and x-ray Total time providing critical care: 30-74  minutes. This excludes time spent performing separately reportable procedures and services. Consults: admitting MD   CRITICAL CARE Performed by: Alfonzo Feller Total critical care time: 35 minutes Critical care time was exclusive of separately billable procedures and treating other patients. Critical care was necessary to treat or prevent imminent or life-threatening deterioration. Critical care was time spent personally by me on the following activities: development of treatment plan with patient and/or surrogate as well as nursing, discussions with consultants, evaluation of patient's response to treatment, examination of patient, obtaining history from patient or surrogate, ordering and performing treatments and interventions, ordering and review of laboratory studies, ordering and review of radiographic studies, pulse oximetry and re-evaluation of patient's condition.  Results for orders placed or performed during the hospital encounter of  03/06/18  Urinalysis, Routine w reflex microscopic  Result Value Ref Range   Color, Urine COLORLESS (A) YELLOW   APPearance CLEAR CLEAR   Specific Gravity, Urine 1.018 1.005 - 1.030   pH 7.0 5.0 - 8.0   Glucose, UA >=500 (A) NEGATIVE mg/dL   Hgb urine dipstick MODERATE (A) NEGATIVE   Bilirubin Urine NEGATIVE NEGATIVE   Ketones, ur NEGATIVE NEGATIVE mg/dL   Protein, ur NEGATIVE NEGATIVE mg/dL   Nitrite NEGATIVE NEGATIVE   Leukocytes, UA NEGATIVE NEGATIVE   RBC / HPF 0-5 0 - 5 RBC/hpf   WBC, UA 0-5 0 - 5 WBC/hpf   Bacteria, UA NONE SEEN NONE SEEN   Squamous Epithelial / LPF 0-5 (A) NONE SEEN  Comprehensive metabolic panel  Result Value Ref Range   Sodium 135 135 - 145 mmol/L   Potassium 3.5 3.5 - 5.1 mmol/L   Chloride 99 (L) 101 - 111 mmol/L   CO2 25 22 - 32 mmol/L   Glucose, Bld 481 (H) 65 - 99 mg/dL   BUN 5 (L) 6 - 20 mg/dL   Creatinine, Ser 0.78 0.44 - 1.00 mg/dL   Calcium 9.2 8.9 - 10.3 mg/dL   Total Protein 6.9 6.5 - 8.1 g/dL    Albumin 3.8 3.5 - 5.0 g/dL   AST 54 (H) 15 - 41 U/L   ALT 92 (H) 14 - 54 U/L   Alkaline Phosphatase 75 38 - 126 U/L   Total Bilirubin 0.4 0.3 - 1.2 mg/dL   GFR calc non Af Amer >60 >60 mL/min   GFR calc Af Amer >60 >60 mL/min   Anion gap 11 5 - 15  Lipase, blood  Result Value Ref Range   Lipase 27 11 - 51 U/L  CBC with Differential  Result Value Ref Range   WBC 5.3 4.0 - 10.5 K/uL   RBC 4.43 3.87 - 5.11 MIL/uL   Hemoglobin 10.3 (L) 12.0 - 15.0 g/dL   HCT 33.9 (L) 36.0 - 46.0 %   MCV 76.5 (L) 78.0 - 100.0 fL   MCH 23.3 (L) 26.0 - 34.0 pg   MCHC 30.4 30.0 - 36.0 g/dL   RDW 19.1 (H) 11.5 - 15.5 %   Platelets 353 150 - 400 K/uL   Neutrophils Relative % 49 %   Neutro Abs 2.6 1.7 - 7.7 K/uL   Lymphocytes Relative 41 %   Lymphs Abs 2.1 0.7 - 4.0 K/uL   Monocytes Relative 8 %   Monocytes Absolute 0.4 0.1 - 1.0 K/uL   Eosinophils Relative 2 %   Eosinophils Absolute 0.1 0.0 - 0.7 K/uL   Basophils Relative 0 %   Basophils Absolute 0.0 0.0 - 0.1 K/uL  CBG monitoring, ED  Result Value Ref Range   Glucose-Capillary 443 (H) 65 - 99 mg/dL  CBG monitoring, ED  Result Value Ref Range   Glucose-Capillary 392 (H) 65 - 99 mg/dL  I-Stat beta hCG blood, ED  Result Value Ref Range   I-stat hCG, quantitative <5.0 <5 mIU/mL   Comment 3           Dg Abd Acute W/chest Result Date: 03/06/2018 CLINICAL DATA:  Hyperglycemia and abdominal pain EXAM: DG ABDOMEN ACUTE W/ 1V CHEST COMPARISON:  02/28/2018 FINDINGS: Cardiac shadow is mildly prominent but stable. The lungs are clear bilaterally. No acute bony abnormality is seen. Scattered large and small bowel gas is noted. Mild retained fecal material is seen. No free air is noted. No obstructive changes are seen. No bony abnormality  is noted. IMPRESSION: No acute abnormality in the chest or abdomen. Electronically Signed   By: Inez Catalina M.D.   On: 03/06/2018 11:27    1040:  DM Coordinator has come to the ED for evaluation: states despite pt's  compliance with insulin/metformin, CBG's have been in 500's; recommends admission for further meds adjustment.  T/C returned from [facility]. Rodena Piety, case discussed, including:  HPI, pertinent PM/SHx, VS/PE, dx testing, ED course and treatment:  Agreeable to admit.      Final Clinical Impressions(s) / ED Diagnoses   Final diagnoses:  Hyperglycemia  Diabetes mellitus due to underlying condition, uncontrolled, with hyperglycemia [facility])    ED Discharge Orders    None       Francine Graven, DO 03/07/18 1402

## 2018-03-06 NOTE — H&P (Signed)
History and Physical    Dana Davenport DSK:[id number] DOB: [birth year] DOA: 03/06/2018  PCP: Patient, No Pcp Per Patient coming from: Home  Chief Complaint: High blood sugar  HPI: Dana Davenport is a 27 y.o. female with no significant medical history admitted with nausea vomiting abdominal pain and diarrhea and high blood sugar.  Was admitted to the hospital 3/25 and discharged 3/26 with a new onset DKA.  She reported that her sugars remained high since a discharge on the 26.  She denied any dietary noncompliance she reported that she did everything right she ate healthy she did not change her activities and she took insulin as ordered still her sugar was above 500.   ED Course: Her sodium was 135 potassium 3.5 BUN 5 creatinine 0.78 her anion gap is 11 lipase is 27 AST ALT is 5492.  UA showed glucose no proteins.  She was started on insulin drip in the ER.  She is being admitted for hyperglycemia with new onset recent DKA.  Review of Systems: As per HPI otherwise all other systems reviewed and are negative Past Medical History:  Diagnosis Date  . Diabetes mellitus without complication [facility])     Past Surgical History:  Procedure Laterality Date  . NO PAST SURGERIES      Social History   Socioeconomic History  . Marital status: Single    Spouse name: Not on file  . Number of children: Not on file  . Years of education: Not on file  . Highest education level: Not on file  Occupational History  . Not on file  Social Needs  . Financial resource strain: Not on file  . Food insecurity:    Worry: Not on file    Inability: Not on file  . Transportation needs:    Medical: Not on file    Non-medical: Not on file  Tobacco Use  . Smoking status: Former Smoker    Types: Cigarettes  . Smokeless tobacco: Never Used  Substance and Sexual Activity  . Alcohol use: No    Frequency: Never  . Drug use: No  . Sexual activity: Not on file  Lifestyle  . Physical activity:    Days per  week: Not on file    Minutes per session: Not on file  . Stress: Not on file  Relationships  . Social connections:    Talks on phone: Not on file    Gets together: Not on file    Attends religious service: Not on file    Active member of club or organization: Not on file    Attends meetings of clubs or organizations: Not on file    Relationship status: Not on file  . Intimate partner violence:    Fear of current or ex partner: Not on file    Emotionally abused: Not on file    Physically abused: Not on file    Forced sexual activity: Not on file  Other Topics Concern  . Not on file  Social History Narrative  . Not on file    No Known Allergies  Family History  Problem Relation Age of Onset  . Kidney Stones Mother   . Anesthesia problems Neg Hx     Prior to Admission medications   Medication Sig Start Date End Date Taking? Authorizing Provider  insulin NPH-regular Human (NOVOLIN 70/30) (70-30) 100 UNIT/ML injection Inject 10 Units into the skin 2 (two) times daily with a meal. 02/28/18  Yes Patrecia Pour, Christean Grief, MD  metFORMIN (GLUCOPHAGE)  500 MG tablet Take 1 tablet (500 mg total) by mouth 2 (two) times daily with a meal. 02/28/18 03/30/18 Yes Doreatha Lew, MD  blood glucose meter kit and supplies Dispense based on patient and insurance preference. Use up to four times daily as directed. (FOR ICD-9 250.00, 250.01). 02/28/18   Patrecia Pour, Christean Grief, MD    Physical Exam: Vitals:   03/06/18 0931 03/06/18 1000 03/06/18 1030 03/06/18 1131  BP: (!) 125/95 (!) 129/92 105/85 108/75  Pulse: 76 77 86 76  Resp:    20  Temp:      TempSrc:      SpO2: 100% 99% 99% 100%  Weight:      Height:         General:  Appears calm and comfortable Eyes:  PERRL, EOMI, normal lids ENT:  grossly normal hearing, lips & tongue, mmm Neck: no LAD, masses or thyromegaly Cardiovascular:  RRR, no m/r/g. No LE edema.  Respiratory:  CTA bilaterally, no w/r/r. Normal respiratory effort. Abdomen:  soft, ntnd, NABS Skin:  no rash or induration seen on limited exam Musculoskeletal:  grossly normal tone BUE/BLE, good ROM, no bony abnormality Psychiatric:  grossly normal mood and affect, speech fluent and appropriate, AOx3 Neurologic:  CN 2-12 grossly intact, moves all extremities in coordinated fashion, sensation intact  Labs on Admission: I have personally reviewed following labs and imaging studies  CBC: Recent Labs  Lab 02/27/18 1346 02/27/18 1947 02/28/18 0814 03/06/18 0928  WBC 9.9 9.7 7.0 5.3  NEUTROABS  --   --  3.8 2.6  HGB 11.2* 9.5* 9.6* 10.3*  HCT 37.0 31.0* 31.5* 33.9*  MCV 75.4* 75.4* 74.6* 76.5*  PLT 367 290 291 149   Basic Metabolic Panel: Recent Labs  Lab 02/27/18 1346 02/27/18 1947 02/28/18 0041 02/28/18 0814 03/06/18 0929  NA 134* 140 139 138 135  K 3.9 4.2 3.5 3.4* 3.5  CL 102 117* 116* 114* 99*  CO2 15* 13* 15* 18* 25  GLUCOSE 355* 186* 189* 151* 481*  BUN 7 6 <5* <5* 5*  CREATININE 1.04* 0.95  0.88 0.80 0.69 0.78  CALCIUM 9.2 8.1* 7.9* 7.9* 9.2   GFR: Estimated Creatinine Clearance: 124.6 mL/min (by C-G formula based on SCr of 0.78 mg/dL). Liver Function Tests: Recent Labs  Lab 03/06/18 0929  AST 54*  ALT 92*  ALKPHOS 75  BILITOT 0.4  PROT 6.9  ALBUMIN 3.8   Recent Labs  Lab 03/06/18 0929  LIPASE 27   No results for input(s): AMMONIA in the last 168 hours. Coagulation Profile: No results for input(s): INR, PROTIME in the last 168 hours. Cardiac Enzymes: No results for input(s): CKTOTAL, CKMB, CKMBINDEX, TROPONINI in the last 168 hours. BNP (last 3 results) No results for input(s): PROBNP in the last 8760 hours. HbA1C: No results for input(s): HGBA1C in the last 72 hours. CBG: Recent Labs  Lab 02/28/18 1209 02/28/18 1618 03/06/18 0910 03/06/18 1107 03/06/18 1210  GLUCAP 202* 232* 443* 392* 348*   Lipid Profile: No results for input(s): CHOL, HDL, LDLCALC, TRIG, CHOLHDL, LDLDIRECT in the last 72 hours. Thyroid  Function Tests: No results for input(s): TSH, T4TOTAL, FREET4, T3FREE, THYROIDAB in the last 72 hours. Anemia Panel: No results for input(s): VITAMINB12, FOLATE, FERRITIN, TIBC, IRON, RETICCTPCT in the last 72 hours. Urine analysis:    Component Value Date/Time   COLORURINE COLORLESS (A) 03/06/2018 [place] 03/06/2018 0938   LABSPEC 1.018 03/06/2018 0938   PHURINE 7.0 03/06/2018 7026  GLUCOSEU >=500 (A) 03/06/2018 0938   HGBUR MODERATE (A) 03/06/2018 0938   BILIRUBINUR NEGATIVE 03/06/2018 0938   KETONESUR NEGATIVE 03/06/2018 0938   PROTEINUR NEGATIVE 03/06/2018 0938   UROBILINOGEN 0.2 01/24/2012 2110   NITRITE NEGATIVE 03/06/2018 0938   LEUKOCYTESUR NEGATIVE 03/06/2018 0938    Creatinine Clearance: Estimated Creatinine Clearance: 124.6 mL/min (by C-G formula based on SCr of 0.78 mg/dL).  Sepsis Labs: @LABRCNTIP (procalcitonin:4,lacticidven:4) ) Recent Results (from the past 240 hour(s))  MRSA PCR Screening     Status: Abnormal   Collection Time: 02/27/18  9:36 PM  Result Value Ref Range Status   MRSA by PCR POSITIVE (A) NEGATIVE Final    Comment:        The GeneXpert MRSA Assay (FDA approved for NASAL specimens only), is one component of a comprehensive MRSA colonization surveillance program. It is not intended to diagnose MRSA infection nor to guide or monitor treatment for MRSA infections. RESULT CALLED TO, READ BACK BY AND VERIFIED WITHLoletha Grayer MERRITT RN [id number] 02/27/18 A NAVARRO Performed at [facility], [place] [address], [place], [place] [address]      Radiological Exams on Admission: Dg Abd Acute W/chest  Result Date: 03/06/2018 CLINICAL DATA:  Hyperglycemia and abdominal pain EXAM: DG ABDOMEN ACUTE W/ 1V CHEST COMPARISON:  02/28/2018 FINDINGS: Cardiac shadow is mildly prominent but stable. The lungs are clear bilaterally. No acute bony abnormality is seen. Scattered large and small bowel gas is noted. Mild retained fecal material  is seen. No free air is noted. No obstructive changes are seen. No bony abnormality is noted. IMPRESSION: No acute abnormality in the chest or abdomen. Electronically Signed   By: Inez Catalina M.D.   On: 03/06/2018 11:27    EKG: Independently reviewed.   Assessment/Plan Active Problems:   DKA (diabetic ketoacidoses) ([place])   1] hyperglycemia-patient discharged 02/28/2018 after diagnosis of non-new onset diabetic ketoacidosis.  Patient reported that she was doing everything right but still her blood sugar was high she is new to insulin and injections and diabetes.  She did complain of crampy abdominal pain and nausea and vomiting.  No fever or chills.  Does have chronic diarrhea.  Continue insulin drip she probably would be able to come out of the drip pretty quickly.  Patient takes 7030 insulin 20 units twice a day at home along with NovoLog 3 times a day with meals this can be restarted once she is off the IV insulin.  Restart metformin prior to discharge.     DVT prophylaxis: Lovenox Code Status: Full code Family Communication: There was a lady in the room when I was talking the patient. Disposition Plan: TBD Consults called: None Admission status: Observation   Georgette Shell MD [facility]  If 7PM-7AM, please contact night-coverage [email or web address] Password [email or web address]  03/06/2018, 1:13 PM

## 2018-03-06 NOTE — ED Notes (Signed)
Diabetic coordinator at patient bedside

## 2018-03-06 NOTE — Progress Notes (Signed)
Inpatient Diabetes Program Recommendations  AACE/ADA: New Consensus Statement on Inpatient Glycemic Control (2015)  Target Ranges:  Prepandial:   less than 140 mg/dL      Peak postprandial:   less than 180 mg/dL (1-2 hours)      Critically ill patients:  140 - 180 mg/dL   Lab Results  Component Value Date   GLUCAP 443 (H) 03/06/2018   HGBA1C 9.7 (H) 02/28/2018    Review of Glycemic Control  Diabetes history: DM2 Outpatient Diabetes medications: 70/30 10 units bid, metformin 500 mg bid Current orders for Inpatient glycemic control: IV insulin   Spoke with pt in ED regarding her high blood sugars at home. Pt states she's been checking her blood sugars 5-6x/day and mostly run 300-500s. States she has followed CHO mod heart healthy diet and drinks lots of water, but never could control blood sugars. Has appt with [facility] next week.  Inpatient Diabetes Program Recommendations:     Agree with IV insulin. Transition to 70/30 20 units bid Novolog 0-15 units tidwc and hs  Follow closely.  Thank you. Dana Davenport, RD, LDN, CDE Inpatient Diabetes Coordinator [phone]

## 2018-03-06 NOTE — Progress Notes (Addendum)
Received page from the ED about this patient.    The person I spoke w/ on the phone stated that the patient has had multiple admissions and the ED MD would like for the DM Coordinator to meet with the pt prior to d/c.  Reviewed chart.  Patient was seen by the DM Coordinator for new diagnosis of DM last week on 02/28/18.  Per notes, this is a brand new diagnosis of DM within the last week.  Was counseled by the DM Coordinator on 02/28/18 and the RN was to provide insulin teaching to the pt prior to d/c.  DM Coordinator called the patient at home on 03/01/18 to follow up with the pt and see how she was doing with insulin injections at home.  Per notes, pt was having elevated CBGs.  Pt was counseled to call the [facility] clinic to receive advice about what to do with her CBGs.    I [facility] and have requested that she visit with patient this AM down in the ED (per the person I spoke with on the phone, pt to d/c home from the ED today).  DM Coordinator will see pt asap today (03/06/18).     --Will follow patient during hospitalization--  Ambrose FinlandJeannine Johnston Lutricia Widjaja RN, MSN, CDE Diabetes Coordinator Inpatient Glycemic Control Team Team Pager: [phone] (8a-5p)

## 2018-03-06 NOTE — ED Notes (Signed)
ED TO INPATIENT HANDOFF REPORT  Name/Age/Gender Allean Found 27 y.o. female  Code Status    Code Status Orders  (From admission, onward)        Start     Ordered   03/06/18 1234  Full code  Continuous     03/06/18 1234    Code Status History    Date Active Date Inactive Code Status Order ID Comments User Context   02/27/2018 1556 02/28/2018 2213 Full Code [id number]  Doreatha Lew, MD ED      Home/SNF/Other Home  Chief Complaint Blood Sugar Issues  Level of Care/Admitting Diagnosis ED Disposition    ED Disposition Condition [facility] Area: [facility] [[id number]]  Level of Care: Stepdown [14]  Admit to SDU based on following criteria: Severe physiological/psychological symptoms:  Any diagnosis requiring assessment & intervention at least every 4 hours on an ongoing basis to obtain desired patient outcomes including stability and rehabilitation  Diagnosis: DKA (diabetic ketoacidoses) [facility]) [[id number]]  Admitting Physician: Georgette Shell [[id number]]  Attending Physician: Georgette Shell [[id number]]  Estimated length of stay: 3 - 4 days  Certification:: I certify this patient will need inpatient services for at least 2 midnights  PT Class (Do Not Modify): Inpatient [101]  PT Acc Code (Do Not Modify): Private [1]       Medical History Past Medical History:  Diagnosis Date  . Diabetes mellitus without complication ([place])     Allergies No Known Allergies  IV Location/Drains/Wounds Patient Lines/Drains/Airways Status   Active Line/Drains/Airways    Name:   Placement date:   Placement time:   Site:   Days:   Peripheral IV 03/06/18 Left Antecubital   03/06/18    0937    Antecubital   less than 1          Labs/Imaging Results for orders placed or performed during the hospital encounter of 03/06/18 (from the past 48 hour(s))  CBG monitoring, ED     Status: Abnormal   Collection Time: 03/06/18  9:10 AM  Result Value  Ref Range   Glucose-Capillary 443 (H) 65 - 99 mg/dL  CBC with Differential     Status: Abnormal   Collection Time: 03/06/18  9:28 AM  Result Value Ref Range   WBC 5.3 4.0 - 10.5 K/uL   RBC 4.43 3.87 - 5.11 MIL/uL   Hemoglobin 10.3 (L) 12.0 - 15.0 g/dL   HCT 33.9 (L) 36.0 - 46.0 %   MCV 76.5 (L) 78.0 - 100.0 fL   MCH 23.3 (L) 26.0 - 34.0 pg   MCHC 30.4 30.0 - 36.0 g/dL   RDW 19.1 (H) 11.5 - 15.5 %   Platelets 353 150 - 400 K/uL   Neutrophils Relative % 49 %   Neutro Abs 2.6 1.7 - 7.7 K/uL   Lymphocytes Relative 41 %   Lymphs Abs 2.1 0.7 - 4.0 K/uL   Monocytes Relative 8 %   Monocytes Absolute 0.4 0.1 - 1.0 K/uL   Eosinophils Relative 2 %   Eosinophils Absolute 0.1 0.0 - 0.7 K/uL   Basophils Relative 0 %   Basophils Absolute 0.0 0.0 - 0.1 K/uL    Comment: Performed at [facility], [place] [address] [place], [place] [address]  Comprehensive metabolic panel     Status: Abnormal   Collection Time: 03/06/18  9:29 AM  Result Value Ref Range   Sodium 135 135 - 145 mmol/L   Potassium 3.5 3.5 -  5.1 mmol/L   Chloride 99 (L) 101 - 111 mmol/L   CO2 25 22 - 32 mmol/L   Glucose, Bld 481 (H) 65 - 99 mg/dL   BUN 5 (L) 6 - 20 mg/dL   Creatinine, Ser 0.78 0.44 - 1.00 mg/dL   Calcium 9.2 8.9 - 10.3 mg/dL   Total Protein 6.9 6.5 - 8.1 g/dL   Albumin 3.8 3.5 - 5.0 g/dL   AST 54 (H) 15 - 41 U/L   ALT 92 (H) 14 - 54 U/L   Alkaline Phosphatase 75 38 - 126 U/L   Total Bilirubin 0.4 0.3 - 1.2 mg/dL   GFR calc non Af Amer >60 >60 mL/min   GFR calc Af Amer >60 >60 mL/min    Comment: (NOTE) The eGFR has been calculated using the CKD EPI equation. This calculation has not been validated in all clinical situations. eGFR's persistently <60 mL/min signify possible Chronic Kidney Disease.    Anion gap 11 5 - 15    Comment: Performed at [facility], [place] [address] [place], [place] [address]  Lipase, blood     Status: None   Collection Time: 03/06/18  9:29 AM   Result Value Ref Range   Lipase 27 11 - 51 U/L    Comment: Performed at [facility], [place] [address], [place], [place] [address]  Urinalysis, Routine w reflex microscopic     Status: Abnormal   Collection Time: 03/06/18  9:38 AM  Result Value Ref Range   Color, Urine COLORLESS (A) YELLOW   APPearance CLEAR CLEAR   Specific Gravity, Urine 1.018 1.005 - 1.030   pH 7.0 5.0 - 8.0   Glucose, UA >=500 (A) NEGATIVE mg/dL   Hgb urine dipstick MODERATE (A) NEGATIVE   Bilirubin Urine NEGATIVE NEGATIVE   Ketones, ur NEGATIVE NEGATIVE mg/dL   Protein, ur NEGATIVE NEGATIVE mg/dL   Nitrite NEGATIVE NEGATIVE   Leukocytes, UA NEGATIVE NEGATIVE   RBC / HPF 0-5 0 - 5 RBC/hpf   WBC, UA 0-5 0 - 5 WBC/hpf   Bacteria, UA NONE SEEN NONE SEEN   Squamous Epithelial / LPF 0-5 (A) NONE SEEN    Comment: Performed at [facility], [place] [address] [place], [place] [address]  I-Stat beta hCG blood, ED     Status: None   Collection Time: 03/06/18  9:38 AM  Result Value Ref Range   I-stat hCG, quantitative <5.0 <5 mIU/mL   Comment 3            Comment:   GEST. AGE      CONC.  (mIU/mL)   <=1 WEEK        5 - 50     2 WEEKS       50 - 500     3 WEEKS       100 - 10,000     4 WEEKS     1,000 - 30,000        FEMALE AND NON-PREGNANT FEMALE:     LESS THAN 5 mIU/mL   CBG monitoring, ED     Status: Abnormal   Collection Time: 03/06/18 11:07 AM  Result Value Ref Range   Glucose-Capillary 392 (H) 65 - 99 mg/dL  CBG monitoring, ED     Status: Abnormal   Collection Time: 03/06/18 12:10 PM  Result Value Ref Range   Glucose-Capillary 348 (H) 65 - 99 mg/dL   Dg Abd Acute W/chest  Result Date: 03/06/2018 CLINICAL  DATA:  Hyperglycemia and abdominal pain EXAM: DG ABDOMEN ACUTE W/ 1V CHEST COMPARISON:  02/28/2018 FINDINGS: Cardiac shadow is mildly prominent but stable. The lungs are clear bilaterally. No acute bony abnormality is seen. Scattered large and small bowel gas is noted. Mild  retained fecal material is seen. No free air is noted. No obstructive changes are seen. No bony abnormality is noted. IMPRESSION: No acute abnormality in the chest or abdomen. Electronically Signed   By: Inez Catalina M.D.   On: 03/06/2018 11:27    Pending Labs Unresulted Labs (From admission, onward)   Start     Ordered   03/07/18 9030  Basic metabolic panel  Tomorrow morning,   R     03/06/18 1234   03/07/18 0500  CBC  Tomorrow morning,   R     03/06/18 1234   03/06/18 0923  Basic metabolic panel  STAT Now then every 4 hours ,   STAT     03/06/18 1232      Vitals/Pain Today's Vitals   03/06/18 0931 03/06/18 1000 03/06/18 1030 03/06/18 1131  BP: (!) 125/95 (!) 129/92 105/85 108/75  Pulse: 76 77 86 76  Resp:    20  Temp:      TempSrc:      SpO2: 100% 99% 99% 100%  Weight:      Height:      PainSc:        Isolation Precautions No active isolations  Medications Medications  dextrose 5 %-0.45 % sodium chloride infusion ( Intravenous Hold 03/06/18 1144)  insulin regular (NOVOLIN R,HUMULIN R) 100 Units in sodium chloride 0.9 % 100 mL (1 Units/mL) infusion (5.8 Units/hr Intravenous Rate/Dose Change 03/06/18 1211)  0.9 %  sodium chloride infusion ( Intravenous New Bag/Given 03/06/18 1108)  0.9 %  sodium chloride infusion (has no administration in time range)  0.9 %  sodium chloride infusion (has no administration in time range)  dextrose 5 %-0.45 % sodium chloride infusion (has no administration in time range)  potassium chloride 10 mEq in 100 mL IVPB (has no administration in time range)  enoxaparin (LOVENOX) injection 40 mg (has no administration in time range)  sodium chloride 0.9 % bolus 1,000 mL (0 mLs Intravenous Stopped 03/06/18 1054)    Mobility walks

## 2018-03-06 NOTE — ED Triage Notes (Signed)
patient was seen a week ago for hyperglycemia and states her glucose has not been under 300 in a week. Patient states she is having abdominal pain. CBG- in triage-443.

## 2018-03-07 DIAGNOSIS — I10 Essential (primary) hypertension: Secondary | ICD-10-CM

## 2018-03-07 DIAGNOSIS — E119 Type 2 diabetes mellitus without complications: Secondary | ICD-10-CM

## 2018-03-07 DIAGNOSIS — E669 Obesity, unspecified: Secondary | ICD-10-CM

## 2018-03-07 LAB — GLUCOSE, CAPILLARY
GLUCOSE-CAPILLARY: 128 mg/dL — AB (ref 65–99)
GLUCOSE-CAPILLARY: 133 mg/dL — AB (ref 65–99)
GLUCOSE-CAPILLARY: 168 mg/dL — AB (ref 65–99)
GLUCOSE-CAPILLARY: 197 mg/dL — AB (ref 65–99)
GLUCOSE-CAPILLARY: 202 mg/dL — AB (ref 65–99)
GLUCOSE-CAPILLARY: 203 mg/dL — AB (ref 65–99)
GLUCOSE-CAPILLARY: 204 mg/dL — AB (ref 65–99)
GLUCOSE-CAPILLARY: 306 mg/dL — AB (ref 65–99)
Glucose-Capillary: 120 mg/dL — ABNORMAL HIGH (ref 65–99)
Glucose-Capillary: 151 mg/dL — ABNORMAL HIGH (ref 65–99)
Glucose-Capillary: 154 mg/dL — ABNORMAL HIGH (ref 65–99)
Glucose-Capillary: 184 mg/dL — ABNORMAL HIGH (ref 65–99)
Glucose-Capillary: 260 mg/dL — ABNORMAL HIGH (ref 65–99)
Glucose-Capillary: 316 mg/dL — ABNORMAL HIGH (ref 65–99)
Glucose-Capillary: 322 mg/dL — ABNORMAL HIGH (ref 65–99)

## 2018-03-07 LAB — BASIC METABOLIC PANEL
ANION GAP: 7 (ref 5–15)
ANION GAP: 9 (ref 5–15)
BUN: 5 mg/dL — ABNORMAL LOW (ref 6–20)
BUN: 7 mg/dL (ref 6–20)
CALCIUM: 8.4 mg/dL — AB (ref 8.9–10.3)
CALCIUM: 8.7 mg/dL — AB (ref 8.9–10.3)
CO2: 24 mmol/L (ref 22–32)
CO2: 27 mmol/L (ref 22–32)
Chloride: 105 mmol/L (ref 101–111)
Chloride: 104 mmol/L (ref 101–111)
Creatinine, Ser: 0.71 mg/dL (ref 0.44–1.00)
Creatinine, Ser: 0.7 mg/dL (ref 0.44–1.00)
GFR calc Af Amer: >60 mL/min (ref >60)
GLUCOSE: 163 mg/dL — AB (ref 65–99)
GLUCOSE: 173 mg/dL — AB (ref 65–99)
Potassium: 3.1 mmol/L — ABNORMAL LOW (ref 3.5–5.1)
Potassium: 2.9 mmol/L — ABNORMAL LOW (ref 3.5–5.1)
Sodium: 138 mmol/L (ref 135–145)
Sodium: 138 mmol/L (ref 135–145)

## 2018-03-07 LAB — CBC
HCT: 30.8 % — ABNORMAL LOW (ref 36.0–46.0)
HEMOGLOBIN: 9.1 g/dL — AB (ref 12.0–15.0)
MCH: 23 pg — ABNORMAL LOW (ref 26.0–34.0)
MCHC: 29.5 g/dL — AB (ref 30.0–36.0)
MCV: 77.8 fL — ABNORMAL LOW (ref 78.0–100.0)
PLATELETS: 333 10*3/uL (ref 150–400)
RBC: 3.96 MIL/uL (ref 3.87–5.11)
RDW: 19.1 % — AB (ref 11.5–15.5)
WBC: 5.8 10*3/uL (ref 4.0–10.5)

## 2018-03-07 MED ORDER — INSULIN ASPART PROT & ASPART (70-30 MIX) 100 UNIT/ML ~~LOC~~ SUSP
20.0000 [IU] | Freq: Two times a day (BID) | SUBCUTANEOUS | Status: DC
  Administered 2018-03-07 – 2018-03-08 (×2): 20 [IU] via SUBCUTANEOUS
  Filled 2018-03-07: qty 10

## 2018-03-07 MED ORDER — MAGNESIUM SULFATE 2 GM/50ML IV SOLN
2.0000 g | Freq: Once | INTRAVENOUS | Status: AC
  Administered 2018-03-07: 2 g via INTRAVENOUS
  Filled 2018-03-07: qty 50

## 2018-03-07 MED ORDER — INSULIN ASPART 100 UNIT/ML ~~LOC~~ SOLN
0.0000 [IU] | Freq: Every day | SUBCUTANEOUS | Status: DC
  Administered 2018-03-07: 4 [IU] via SUBCUTANEOUS

## 2018-03-07 MED ORDER — INSULIN GLARGINE 100 UNIT/ML ~~LOC~~ SOLN
10.0000 [IU] | Freq: Every day | SUBCUTANEOUS | Status: DC
  Administered 2018-03-07: 10 [IU] via SUBCUTANEOUS
  Filled 2018-03-07: qty 0.1

## 2018-03-07 MED ORDER — INSULIN ASPART 100 UNIT/ML ~~LOC~~ SOLN
0.0000 [IU] | Freq: Three times a day (TID) | SUBCUTANEOUS | Status: DC
  Administered 2018-03-07: 8 [IU] via SUBCUTANEOUS
  Administered 2018-03-07: 2 [IU] via SUBCUTANEOUS
  Administered 2018-03-08: 3 [IU] via SUBCUTANEOUS

## 2018-03-07 MED ORDER — FERROUS SULFATE 325 (65 FE) MG PO TABS
325.0000 mg | ORAL_TABLET | Freq: Every day | ORAL | Status: DC
  Administered 2018-03-08: 325 mg via ORAL
  Filled 2018-03-07: qty 1

## 2018-03-07 MED ORDER — POTASSIUM CHLORIDE CRYS ER 10 MEQ PO TBCR
40.0000 meq | EXTENDED_RELEASE_TABLET | Freq: Three times a day (TID) | ORAL | Status: AC
  Administered 2018-03-07 (×3): 40 meq via ORAL
  Filled 2018-03-07: qty 4
  Filled 2018-03-07 (×2): qty 2

## 2018-03-07 NOTE — Progress Notes (Signed)
Inpatient Diabetes Program Recommendations  AACE/ADA: New Consensus Statement on Inpatient Glycemic Control (2015)  Target Ranges:  Prepandial:   less than 140 mg/dL      Peak postprandial:   less than 180 mg/dL (1-2 hours)      Critically ill patients:  140 - 180 mg/dL   Lab Results  Component Value Date   GLUCAP 128 (H) 03/07/2018   HGBA1C 9.7 (H) 02/28/2018    Review of Glycemic Control  Spoke with pt regarding discontinuation of insulin drip. Pt states she ate a good lunch.  DKA resolved.  Inpatient Diabetes Program Recommendations:     Restart metformin 500 mg bid prior to discharge.  Thank you. Ailene Ardshonda Masaru Chamberlin, RD, LDN, CDE Inpatient Diabetes Coordinator [phone]

## 2018-03-07 NOTE — Progress Notes (Signed)
Patient is transferred from ICU at 1755. Alert and oriented x4. Vital signs was checked, BG was checked as ordered. Will monitor patient as protocol.

## 2018-03-07 NOTE — Progress Notes (Signed)
PROGRESS NOTE  Dana Davenport:[id number] DOB: May 02, 1991 DOA: 03/06/2018 PCP: Patient, No Pcp Per  HPI/Recap of past 24 hours:  Dana Davenport is a 27 y.o. female with no significant medical history admitted with nausea vomiting abdominal pain and diarrhea and high blood sugar.  Was admitted to the hospital 3/25 and discharged 3/26 with a new onset DKA.  She reported that her sugars remained high since a discharge on the 26.  She denied any dietary noncompliance she reported that she did everything right she ate healthy she did not change her activities and she took insulin as ordered still her sugar was above 500  03/07/18: Patient seen and examined at bedside.  She reports last night she had some epigastric pain that has now resolved.  She denies nausea.  She has no new complaints.  Assessment/Plan: Active Problems:   DKA (diabetic ketoacidoses) (HCC)  DKA Anion gap is closed and blood sugar less than 200 Transitioning to long-acting insulin Started on insulin 7030 20 units twice daily Restarted on insulin sliding scale Continue Accu-Cheks Continue to monitor electrolytes  Hypokalemia Potassium 3.1 Repleted with potassium 40 mEq 3 times daily x3 doses Also added IV magnesium 2 g Repeat chemistry panel in the morning  Obesity BMI 37 Weight loss outpatient  Microcytic anemia Obtain iron studies No overt sign of bleeding Hemoglobin 9 Baseline hemoglobin 10 Repeat CBC in the morning    Code Status: Full code  Family Communication: None at bedside  Disposition Plan: Home when clinically stable   Consultants:  None  Procedures:  None  Antimicrobials:  None  DVT prophylaxis: SCDs   Objective: Vitals:   03/07/18 0110 03/07/18 0324 03/07/18 0800 03/07/18 1200  BP: 114/71     Pulse: 82     Resp: 15     Temp:  98.1 F (36.7 C) 97.9 F (36.6 C) 97.7 F (36.5 C)  TempSrc:  Oral Oral Oral  SpO2: 98%     Weight:      Height:         Intake/Output Summary (Last 24 hours) at 03/07/2018 1332 Last data filed at 03/07/2018 1300 Gross per 24 hour  Intake 5433.91 ml  Output 2400 ml  Net 3033.91 ml   Filed Weights   03/06/18 0915  Weight: 101.2 kg (223 lb)    Exam:   General: 27 year old African-American female well-developed well-nourished no acute distress.  Alert and oriented x3.  Cardiovascular: Regular rate and rhythm with no rubs or gallops.  No JVD or thyromegaly present.  Respiratory: Clear to auscultation with no wheezes or rales.  Abdomen: Soft nontender nondistended normal bowel sounds x4 quadrant.  Musculoskeletal: No focal motor deficits  Skin: No ulcerative lesions  Psychiatry: Mood is appropriate for condition and setting.   Data Reviewed: CBC: Recent Labs  Lab 03/06/18 0928 03/07/18 0059  WBC 5.3 5.8  NEUTROABS 2.6  --   HGB 10.3* 9.1*  HCT 33.9* 30.8*  MCV 76.5* 77.8*  PLT 353 333   Basic Metabolic Panel: Recent Labs  Lab 03/06/18 0929 03/06/18 1440 03/06/18 1919 03/07/18 0059 03/07/18 0457  NA 135 139 138 138 138  K 3.5 3.1* 3.3* 3.1* 2.9*  CL 99* 106 106 105 104  CO2 25 27 25 24 27   GLUCOSE 481* 195* 206* 173* 163*  BUN 5* 5* 7 7 5*  CREATININE 0.78 0.74 0.78 0.71 0.70  CALCIUM 9.2 8.7* 8.6* 8.7* 8.4*   GFR: Estimated Creatinine Clearance: 124.6 mL/min (by C-G formula based  on SCr of 0.7 mg/dL). Liver Function Tests: Recent Labs  Lab 03/06/18 0929  AST 54*  ALT 92*  ALKPHOS 75  BILITOT 0.4  PROT 6.9  ALBUMIN 3.8   Recent Labs  Lab 03/06/18 0929  LIPASE 27   No results for input(s): AMMONIA in the last 168 hours. Coagulation Profile: No results for input(s): INR, PROTIME in the last 168 hours. Cardiac Enzymes: No results for input(s): CKTOTAL, CKMB, CKMBINDEX, TROPONINI in the last 168 hours. BNP (last 3 results) No results for input(s): PROBNP in the last 8760 hours. HbA1C: No results for input(s): HGBA1C in the last 72 hours. CBG: Recent Labs   Lab 03/07/18 0640 03/07/18 0745 03/07/18 0854 03/07/18 0951 03/07/18 1151  GLUCAP 204* 184* 202* 203* 128*   Lipid Profile: No results for input(s): CHOL, HDL, LDLCALC, TRIG, CHOLHDL, LDLDIRECT in the last 72 hours. Thyroid Function Tests: No results for input(s): TSH, T4TOTAL, FREET4, T3FREE, THYROIDAB in the last 72 hours. Anemia Panel: No results for input(s): VITAMINB12, FOLATE, FERRITIN, TIBC, IRON, RETICCTPCT in the last 72 hours. Urine analysis:    Component Value Date/Time   COLORURINE COLORLESS (A) 03/06/2018 0938   APPEARANCEUR CLEAR 03/06/2018 0938   LABSPEC 1.018 03/06/2018 0938   PHURINE 7.0 03/06/2018 0938   GLUCOSEU >=500 (A) 03/06/2018 0938   HGBUR MODERATE (A) 03/06/2018 0938   BILIRUBINUR NEGATIVE 03/06/2018 0938   KETONESUR NEGATIVE 03/06/2018 0938   PROTEINUR NEGATIVE 03/06/2018 0938   UROBILINOGEN 0.2 01/24/2012 2110   NITRITE NEGATIVE 03/06/2018 0938   LEUKOCYTESUR NEGATIVE 03/06/2018 0938   Sepsis Labs: @LABRCNTIP (procalcitonin:4,lacticidven:4)  ) Recent Results (from the past 240 hour(s))  MRSA PCR Screening     Status: Abnormal   Collection Time: 02/27/18  9:36 PM  Result Value Ref Range Status   MRSA by PCR POSITIVE (A) NEGATIVE Final    Comment:        The GeneXpert MRSA Assay (FDA approved for NASAL specimens only), is one component of a comprehensive MRSA colonization surveillance program. It is not intended to diagnose MRSA infection nor to guide or monitor treatment for MRSA infections. RESULT CALLED TO, READ BACK BY AND VERIFIED WITHSalena Saner MERRITT RN [id number] 02/27/18 A NAVARRO Performed at [facility], [address], [place], [place] [address]   MRSA PCR Screening     Status: None   Collection Time: 03/06/18  2:41 PM  Result Value Ref Range Status   MRSA by PCR NEGATIVE NEGATIVE Final    Comment:        The GeneXpert MRSA Assay (FDA approved for NASAL specimens only), is one component of a comprehensive MRSA  colonization surveillance program. It is not intended to diagnose MRSA infection nor to guide or monitor treatment for MRSA infections. DELTA CHECK NOTED Performed at [facility], [address] [place], [place] [address]       Studies: No results found.  Scheduled Meds: . enoxaparin (LOVENOX) injection  40 mg Subcutaneous Q24H  . insulin aspart  0-15 Units Subcutaneous TID WC  . insulin aspart  0-5 Units Subcutaneous QHS  . insulin aspart protamine- aspart  20 Units Subcutaneous BID WC  . potassium chloride  40 mEq Oral TID    Continuous Infusions: . sodium chloride Stopped (03/06/18 1300)  . sodium chloride    . sodium chloride    . dextrose 5 % and 0.45% NaCl Stopped (03/06/18 1144)  . dextrose 5 % and 0.45% NaCl    . dextrose 5 %  and 0.45% NaCl Stopped (03/07/18 1254)  . insulin (NOVOLIN-R) infusion Stopped (03/07/18 1255)     LOS: 1 day     Darlin Droparole N Satya Buttram, MD [facility] Pager [phone]  If 7PM-7AM, please contact night-coverage [email or web address] Password [email or web address] 03/07/2018, 1:32 PM

## 2018-03-08 DIAGNOSIS — E1110 Type 2 diabetes mellitus with ketoacidosis without coma: Principal | ICD-10-CM

## 2018-03-08 DIAGNOSIS — R739 Hyperglycemia, unspecified: Secondary | ICD-10-CM

## 2018-03-08 DIAGNOSIS — E0865 Diabetes mellitus due to underlying condition with hyperglycemia: Secondary | ICD-10-CM

## 2018-03-08 LAB — IRON AND TIBC
Iron: 16 ug/dL — ABNORMAL LOW (ref 28–170)
Saturation Ratios: 4 % — ABNORMAL LOW (ref 10.4–31.8)
TIBC: 365 ug/dL (ref 250–450)
UIBC: 349 ug/dL

## 2018-03-08 LAB — CBC
HEMATOCRIT: 32.4 % — AB (ref 36.0–46.0)
Hemoglobin: 9.5 g/dL — ABNORMAL LOW (ref 12.0–15.0)
MCH: 22.8 pg — AB (ref 26.0–34.0)
MCHC: 29.3 g/dL — ABNORMAL LOW (ref 30.0–36.0)
MCV: 77.7 fL — AB (ref 78.0–100.0)
Platelets: 326 10*3/uL (ref 150–400)
RBC: 4.17 MIL/uL (ref 3.87–5.11)
RDW: 19.2 % — AB (ref 11.5–15.5)
WBC: 5.1 10*3/uL (ref 4.0–10.5)

## 2018-03-08 LAB — BASIC METABOLIC PANEL
ANION GAP: 7 (ref 5–15)
CO2: 26 mmol/L (ref 22–32)
Calcium: 9 mg/dL (ref 8.9–10.3)
Chloride: 106 mmol/L (ref 101–111)
Creatinine, Ser: 0.73 mg/dL (ref 0.44–1.00)
GFR calc Af Amer: >60 mL/min (ref >60)
GFR calc non Af Amer: >60 mL/min (ref >60)
Glucose, Bld: 194 mg/dL — ABNORMAL HIGH (ref 65–99)
POTASSIUM: 4.2 mmol/L (ref 3.5–5.1)
Sodium: 139 mmol/L (ref 135–145)

## 2018-03-08 LAB — MAGNESIUM: Magnesium: 1.8 mg/dL (ref 1.7–2.4)

## 2018-03-08 LAB — GLUCOSE, CAPILLARY: Glucose-Capillary: 180 mg/dL — ABNORMAL HIGH (ref 65–99)

## 2018-03-08 LAB — FERRITIN: Ferritin: 13 ng/mL (ref 11–307)

## 2018-03-08 MED ORDER — DICYCLOMINE HCL 10 MG PO CAPS
10.0000 mg | ORAL_CAPSULE | Freq: Three times a day (TID) | ORAL | Status: DC
  Administered 2018-03-08: 10 mg via ORAL
  Filled 2018-03-08: qty 1

## 2018-03-08 MED ORDER — FERROUS SULFATE 325 (65 FE) MG PO TABS: 325.0000 mg | ORAL_TABLET | Freq: Every day | ORAL | 0 refills | Status: AC

## 2018-03-08 MED ORDER — INSULIN REGULAR HUMAN 100 UNIT/ML IJ SOLN: INTRAMUSCULAR | 0 refills | Status: AC

## 2018-03-08 MED ORDER — DICYCLOMINE HCL 10 MG PO CAPS: 10.0000 mg | ORAL_CAPSULE | Freq: Three times a day (TID) | ORAL | 0 refills | Status: AC

## 2018-03-08 MED ORDER — INSULIN NPH ISOPHANE & REGULAR (70-30) 100 UNIT/ML ~~LOC~~ SUSP: 25.0000 [IU] | Freq: Two times a day (BID) | SUBCUTANEOUS | 1 refills | Status: DC

## 2018-03-08 MED ORDER — METFORMIN HCL 500 MG PO TABS
500.0000 mg | ORAL_TABLET | Freq: Two times a day (BID) | ORAL | Status: DC
  Administered 2018-03-08: 500 mg via ORAL
  Filled 2018-03-08: qty 1

## 2018-03-08 NOTE — Progress Notes (Signed)
Inpatient Diabetes Program Recommendations  AACE/ADA: New Consensus Statement on Inpatient Glycemic Control (2015)  Target Ranges:  Prepandial:   less than 140 mg/dL      Peak postprandial:   less than 180 mg/dL (1-2 hours)      Critically ill patients:  140 - 180 mg/dL   Lab Results  Component Value Date   GLUCAP 180 (H) 03/08/2018   HGBA1C 9.7 (H) 02/28/2018   Blood sugars improving. Blood sugars 194, 180 this am. Received Lantus 10 units and 70/30 20 units yesterday, along with Novolog 14 units correction insulin.  Inpatient Diabetes Program Recommendations:   For Discharge:  70/30 25 units bid Novolin R 0-15 units tidwc (correction insulin if needed at home) [facility]. Take logbook of blood sugars.   Continue to follow.  Thank you. Ailene Ardshonda Deloyd Handy, RD, LDN, CDE Inpatient Diabetes Coordinator [phone]

## 2018-03-08 NOTE — Progress Notes (Signed)
Physician Discharge Summary  Alessandria Henken ZOX:[id number] DOB: 03/24/1991 DOA: 03/06/2018  PCP: Patient, No Pcp Per  Admit date: 03/06/2018 Discharge date: 03/08/2018  Admitted From: Home Disposition: Home  Discharge Condition:Stable CODE STATUS:Full Diet recommendation: Carbohydrate consistent  Brief/Interim Summary: Dana Crockettis a 27 y.o.femalewithno significant medical history admitted with nausea ,vomiting ,abdominal pain and diarrhea and high blood sugar. She was admitted to the hospital 3/25 and discharged 3/26 with a new onset DKA. She reported that her sugars remained high since a discharge on the 3/26. She denied any dietary noncompliance, she reported that she did everything right, she ate healthy and she did not change her activities and she took insulin as ordered but still her sugar was above 500. Patient was started on insulin drip on admission which has been stopped .  She was also evaluated by diabetic coordinator.  Her symptoms of nausea, vomiting and abdominal pain with the diarrhea have resolved. We have increased the dose of 70/30 insulin to 25 units twice daily.  She will continue metformin 500 mg twice a day at home .  In addition, she has also been prescribed Novolin R: 0-15 units TID with meals for correction. Advised to monitor her blood glucose at home with a log book.  She has the necessary supplies from her last visit.  She has an appointment with [facility] on 03/17/18.   Following problems were addressed during her hospitalization:  DKA/new onset diabetes mellitus Anion gap is closed and blood sugar less than 200 Transitioned to long-acting insulin Started on insulin 70/30 ,25 units twice daily Restarted on insulin sliding scale Electrolytes stable  Hypokalemia Supplemented and corrected  Obesity BMI 37 She has been counseled on the importance of adherence to diabetic diet and regular exercise.  Microcytic  anemia Iron studies showed low iron. No overt sign of bleeding Hemoglobin 9.5 this morning Baseline hemoglobin 10 Started on iron supplementation    Discharge Diagnoses:  Active Problems:   DKA (diabetic ketoacidoses) [facility])    Discharge Instructions  Discharge Instructions    Diet Carb Modified   Complete by:  As directed    Discharge instructions   Complete by:  As directed    1) Take prescribed medications as instructed. 2) Monitor your blood glucose at home. Make a log book 3) Follow up at [facility] on your upcoming appointment.   Increase activity slowly   Complete by:  As directed      Allergies as of 03/08/2018   No Known Allergies     Medication List    TAKE these medications   blood glucose meter kit and supplies Dispense based on patient and insurance preference. Use up to four times daily as directed. (FOR ICD-9 250.00, 250.01).   dicyclomine 10 MG capsule Commonly known as:  BENTYL Take 1 capsule (10 mg total) by mouth 4 (four) times daily -  before meals and at bedtime.   ferrous sulfate 325 (65 FE) MG tablet Take 1 tablet (325 mg total) by mouth daily with breakfast. Start taking on:  03/09/2018   insulin NPH-regular Human (70-30) 100 UNIT/ML injection Commonly known as:  NOVOLIN 70/30 Inject 25 Units into the skin 2 (two) times daily with a meal. What changed:  how much to take   insulin regular 100 units/mL injection Commonly known as:  NOVOLIN R,HUMULIN R Use  0-15 units tidwc (correction insulin if needed at home)   metFORMIN 500 MG tablet Commonly known  as:  GLUCOPHAGE Take 1 tablet (500 mg total) by mouth 2 (two) times daily with a meal.      Follow-up Information    [place]. Go on 03/17/2018.   Why:  @ 9:30a-bring photo ID,discharge papers Contact information: [address] [place] [address] [phone]         No Known  Allergies  Consultations:  None   Procedures/Studies: Dg Chest Port 1 View  Result Date: 02/28/2018 CLINICAL DATA:  Cough and congestion EXAM: PORTABLE CHEST 1 VIEW COMPARISON:  01/24/2018 FINDINGS: Cardiac shadow is within normal limits. The lungs are well aerated bilaterally. No focal infiltrate or sizable effusion is seen. No bony abnormality is noted. IMPRESSION: No active disease. Electronically Signed   By: Inez Catalina M.D.   On: 02/28/2018 09:11   Dg Abd Acute W/chest  Result Date: 03/06/2018 CLINICAL DATA:  Hyperglycemia and abdominal pain EXAM: DG ABDOMEN ACUTE W/ 1V CHEST COMPARISON:  02/28/2018 FINDINGS: Cardiac shadow is mildly prominent but stable. The lungs are clear bilaterally. No acute bony abnormality is seen. Scattered large and small bowel gas is noted. Mild retained fecal material is seen. No free air is noted. No obstructive changes are seen. No bony abnormality is noted. IMPRESSION: No acute abnormality in the chest or abdomen. Electronically Signed   By: Inez Catalina M.D.   On: 03/06/2018 11:27    (Echo, Carotid, EGD, Colonoscopy, ERCP)    Subjective: Patient seen and examined the bedside this morning.  Remains comfortable.  Her blood sugars are more controlled now.  Discharge planning discussed with the patient.  Stable for discharge today to home.  Discharge Exam: Vitals:   03/08/18 0356 03/08/18 0533  BP:  104/67  Pulse: 68 73  Resp: 17 20  Temp:  97.9 F (36.6 C)  SpO2:  99%   Vitals:   03/07/18 1752 03/07/18 2039 03/08/18 0356 03/08/18 0533  BP: (!) 111/92 109/69  104/67  Pulse: 85 74 68 73  Resp:  (!) 21 17 20   Temp: 98.3 F (36.8 C) 98.2 F (36.8 C)  97.9 F (36.6 C)  TempSrc: Oral Oral  Oral  SpO2: 100% 100%  99%  Weight:      Height:        General: Pt is alert, awake, not in acute distress,obese Cardiovascular: RRR, S1/S2 +, no rubs, no gallops Respiratory: CTA bilaterally, no wheezing, no rhonchi Abdominal: Soft, NT, ND, bowel  sounds + Extremities: no edema, no cyanosis    The results of significant diagnostics from this hospitalization (including imaging, microbiology, ancillary and laboratory) are listed below for reference.     Microbiology: Recent Results (from the past 240 hour(s))  MRSA PCR Screening     Status: Abnormal   Collection Time: 02/27/18  9:36 PM  Result Value Ref Range Status   MRSA by PCR POSITIVE (A) NEGATIVE Final    Comment:        The GeneXpert MRSA Assay (FDA approved for NASAL specimens only), is one component of a comprehensive MRSA colonization surveillance program. It is not intended to diagnose MRSA infection nor to guide or monitor treatment for MRSA infections. RESULT CALLED TO, READ BACK BY AND VERIFIED WITHLoletha Grayer MERRITT RN 2307 02/27/18 A NAVARRO Performed at [facility], [place] [address], [place], [place] [address]   MRSA PCR Screening     Status: None   Collection Time: 03/06/18  2:41 PM  Result Value Ref Range Status  MRSA by PCR NEGATIVE NEGATIVE Final    Comment:        The GeneXpert MRSA Assay (FDA approved for NASAL specimens only), is one component of a comprehensive MRSA colonization surveillance program. It is not intended to diagnose MRSA infection nor to guide or monitor treatment for MRSA infections. DELTA CHECK NOTED Performed at [facility], [place] [address] [place], [place] [address]      Labs: BNP (last 3 results) No results for input(s): BNP in the last 8760 hours. Basic Metabolic Panel: Recent Labs  Lab 03/06/18 1440 03/06/18 1919 03/07/18 0059 03/07/18 0457 03/08/18 0610  NA 139 138 138 138 139  K 3.1* 3.3* 3.1* 2.9* 4.2  CL 106 106 105 104 106  CO2 27 25 24 27 26   GLUCOSE 195* 206* 173* 163* 194*  BUN 5* 7 7 5* <5*  CREATININE 0.74 0.78 0.71 0.70 0.73  CALCIUM 8.7* 8.6* 8.7* 8.4* 9.0  MG  --   --   --   --  1.8   Liver Function Tests: Recent Labs  Lab 03/06/18 0929  AST 54*  ALT  92*  ALKPHOS 75  BILITOT 0.4  PROT 6.9  ALBUMIN 3.8   Recent Labs  Lab 03/06/18 0929  LIPASE 27   No results for input(s): AMMONIA in the last 168 hours. CBC: Recent Labs  Lab 03/06/18 0928 03/07/18 0059 03/08/18 0610  WBC 5.3 5.8 5.1  NEUTROABS 2.6  --   --   HGB 10.3* 9.1* 9.5*  HCT 33.9* 30.8* 32.4*  MCV 76.5* 77.8* 77.7*  PLT 353 333 326   Cardiac Enzymes: No results for input(s): CKTOTAL, CKMB, CKMBINDEX, TROPONINI in the last 168 hours. BNP: Invalid input(s): POCBNP CBG: Recent Labs  Lab 03/07/18 1735 03/07/18 1804 03/07/18 1844 03/07/18 2044 03/08/18 0746  GLUCAP 260* 306* 316* 322* 180*   D-Dimer No results for input(s): DDIMER in the last 72 hours. Hgb A1c No results for input(s): HGBA1C in the last 72 hours. Lipid Profile No results for input(s): CHOL, HDL, LDLCALC, TRIG, CHOLHDL, LDLDIRECT in the last 72 hours. Thyroid function studies No results for input(s): TSH, T4TOTAL, T3FREE, THYROIDAB in the last 72 hours.  Invalid input(s): FREET3 Anemia work up Recent Labs    03/08/18 0610  FERRITIN 13  TIBC 365  IRON 16*   Urinalysis    Component Value Date/Time   COLORURINE COLORLESS (A) 03/06/2018 [place] 03/06/2018 0938   LABSPEC 1.018 03/06/2018 0938   PHURINE 7.0 03/06/2018 0938   GLUCOSEU >=500 (A) 03/06/2018 0938   HGBUR MODERATE (A) 03/06/2018 0938   BILIRUBINUR NEGATIVE 03/06/2018 0938   KETONESUR NEGATIVE 03/06/2018 0938   PROTEINUR NEGATIVE 03/06/2018 0938   UROBILINOGEN 0.2 01/24/2012 2110   NITRITE NEGATIVE 03/06/2018 0938   LEUKOCYTESUR NEGATIVE 03/06/2018 0938   Sepsis Labs Invalid input(s): PROCALCITONIN,  WBC,  LACTICIDVEN Microbiology Recent Results (from the past 240 hour(s))  MRSA PCR Screening     Status: Abnormal   Collection Time: 02/27/18  9:36 PM  Result Value Ref Range Status   MRSA by PCR POSITIVE (A) NEGATIVE Final    Comment:        The GeneXpert MRSA Assay (FDA approved for NASAL  specimens only), is one component of a comprehensive MRSA colonization surveillance program. It is not intended to diagnose MRSA infection nor to guide or monitor treatment for MRSA infections. RESULT CALLED TO, READ BACK BY AND VERIFIED WITH: C MERRITT RN 2307 02/27/18 A  [facility] Performed at [facility], [place] [address], [place], [place] [address]   MRSA PCR Screening     Status: None   Collection Time: 03/06/18  2:41 PM  Result Value Ref Range Status   MRSA by PCR NEGATIVE NEGATIVE Final    Comment:        The GeneXpert MRSA Assay (FDA approved for NASAL specimens only), is one component of a comprehensive MRSA colonization surveillance program. It is not intended to diagnose MRSA infection nor to guide or monitor treatment for MRSA infections. DELTA CHECK NOTED Performed at [facility], [place] [address], [place], [place] [address]      Time coordinating discharge: Over 30 minutes  SIGNED:   Shelly Coss, MD  [facility] 03/08/2018, 11:27 AM Pager [phone]  If 7PM-7AM, please contact night-coverage [email or web address] Password [email or web address]

## 2018-03-08 NOTE — Progress Notes (Signed)
Patient has discharged to home at 03/08/18.Discharge instruction including medications and appointments was given to patient. DM coordinator educated patient about medication at home and diet. Patient has no question at this time.

## 2018-03-09 NOTE — Discharge Summary (Signed)
Physician Discharge Summary    Dana Davenport KGM:[id number] DOB: 1991/01/17 DOA: 03/06/2018     PCP: Patient, No Pcp Per     Admit date: 03/06/2018  Discharge date: 03/08/2018     Admitted From: Home  Disposition: Home     Discharge Condition:Stable  CODE STATUS:Full  Diet recommendation: Carbohydrate consistent     Brief/Interim Summary:  Dana Davenport is a 27 y.o. female with no significant medical history admitted with nausea ,vomiting ,abdominal pain and diarrhea and high blood sugar.  She was admitted to the hospital 3/25 and discharged 3/26 with a new onset DKA.  She reported that her sugars remained high since a discharge on the 3/26.  She denied any dietary noncompliance, she reported that she did everything right, she ate healthy and she did not change her activities and she took insulin as ordered but still her sugar was above 500.  Patient was started on insulin drip on admission which has been stopped .  She was also evaluated by diabetic coordinator.  Her symptoms of nausea, vomiting and abdominal pain with the diarrhea have resolved.  We have increased the dose of 70/30 insulin to 25 units twice daily.  She will continue metformin 500 mg twice a day at home .  In addition, she has also been prescribed Novolin R: 0-15 units TID with meals for correction.  Advised to monitor her blood glucose at home with a log book.  She has the necessary supplies from her last visit.  She has an appointment with [facility] on 03/17/18.        Following problems were addressed during her hospitalization:     DKA/new onset diabetes mellitus  Anion gap is closed and blood sugar less than 200  Transitioned to long-acting insulin  Started on insulin 70/30 ,25 units twice daily  Restarted on insulin sliding scale  Electrolytes stable     Hypokalemia  Supplemented and corrected     Obesity  BMI 37  She has been counseled  on the importance of adherence to diabetic diet and regular exercise.     Microcytic anemia  Iron studies showed low iron.  No overt sign of bleeding  Hemoglobin 9.5 this morning  Baseline hemoglobin 10  Started on iron supplementation           Discharge Diagnoses:   Active Problems:    DKA (diabetic ketoacidoses) [facility])           Discharge Instructions          Discharge Instructions          Diet Carb Modified     Complete by:  As directed           Discharge instructions     Complete by:  As directed           1) Take prescribed medications as instructed.  2) Monitor your blood glucose at home. Make a log book  3) Follow up at [facility] on your upcoming appointment.        Increase activity slowly     Complete by:  As directed                 Allergies as of 03/08/2018     No Known Allergies                 Medication List          TAKE these  medications      blood glucose meter kit and supplies  Dispense based on patient and insurance preference. Use up to four times daily as directed. (FOR ICD-9 250.00, 250.01).       dicyclomine 10 MG capsule  Commonly known as:  BENTYL  Take 1 capsule (10 mg total) by mouth 4 (four) times daily -  before meals and at bedtime.       ferrous sulfate 325 (65 FE) MG tablet  Take 1 tablet (325 mg total) by mouth daily with breakfast.  Start taking on:  03/09/2018       insulin NPH-regular Human (70-30) 100 UNIT/ML injection  Commonly known as:  NOVOLIN 70/30  Inject 25 Units into the skin 2 (two) times daily with a meal.  What changed:  how much to take       insulin regular 100 units/mL injection  Commonly known as:  NOVOLIN R,HUMULIN R  Use  0-15 units tidwc (correction insulin if needed at home)       metFORMIN 500 MG tablet  Commonly known as:  GLUCOPHAGE  Take 1 tablet (500 mg total) by  mouth 2 (two) times daily with a meal.                    Follow-up Information            [place]. Go on 03/17/2018.     Why:  @ 9:30a-bring photo ID,discharge papers  Contact information:  [address]  [place]  [address]  [phone]                        No Known Allergies     Consultations:  .None         Procedures/Studies:  Imaging Results                         .    (Echo, Carotid, EGD, Colonoscopy, ERCP)         Subjective:  Patient seen and examined the bedside this morning.  Remains comfortable.  Her blood sugars are more controlled now.  Discharge planning discussed with the patient.  Stable for discharge today to home.     Discharge Exam:       Vitals:        03/08/18 0356   03/08/18 0533    BP:       104/67    Pulse:   68   73    Resp:   17   20    Temp:       97.9 F (36.6 C)    SpO2:       99%              Vitals:        03/07/18 1752   03/07/18 2039   03/08/18 0356   03/08/18 0533    BP:   (!) 111/92   109/69       104/67    Pulse:   85   74   68   73    Resp:       (!) 21   17   20     Temp:   98.3 F (36.8 C)   98.2 F (36.8 C)       97.9 F (36.6 C)    TempSrc:   Oral   Oral  Oral    SpO2:   100%   100%       99%    Weight:                    Height:                          General: Pt is alert, awake, not in acute distress,obese  Cardiovascular: RRR, S1/S2 +, no rubs, no gallops  Respiratory: CTA bilaterally, no wheezing, no rhonchi  Abdominal: Soft, NT, ND, bowel sounds +  Extremities: no edema, no cyanosis               The results of significant diagnostics from this hospitalization (including imaging, microbiology, ancillary and laboratory) are  listed below for reference.           Microbiology:          Recent Results (from the past 240 hour(s))    MRSA PCR Screening     Status: Abnormal        Collection Time: 02/27/18  9:36 PM    Result   Value   Ref Range   Status        MRSA by PCR   POSITIVE (A)   NEGATIVE   Final            Comment:           The GeneXpert MRSA Assay (FDA  approved for NASAL specimens  only), is one component of a  comprehensive MRSA colonization  surveillance program. It is not  intended to diagnose MRSA  infection nor to guide or  monitor treatment for  MRSA infections.  RESULT CALLED TO, READ BACK BY AND VERIFIED WITHLoletha Grayer MERRITT RN 2307 02/27/18 A NAVARRO  Performed at [facility], [place] [address] [place], [place] [address]       MRSA PCR Screening     Status: None        Collection Time: 03/06/18  2:41 PM    Result   Value   Ref Range   Status        MRSA by PCR   NEGATIVE   NEGATIVE   Final            Comment:           The GeneXpert MRSA Assay (FDA  approved for NASAL specimens  only), is one component of a  comprehensive MRSA colonization  surveillance program. It is not  intended to diagnose MRSA  infection nor to guide or  monitor treatment for  MRSA infections.  DELTA CHECK NOTED  Performed at [facility], [place] [address] [place], [place] [address]             Labs:  BNP (last 3 results)   Recent Labs (within last 365 days)       Basic Metabolic Panel:  Last Labs  Liver Function Tests:  Last Labs                                                                     Last Labs                                   Last Labs        CBC:  Last Labs                                                                                                                     Cardiac Enzymes:   Last Labs        BNP:   Last Labs        CBG:  Last Labs                                                                      D-Dimer   Recent Labs (last 2 labs)        Hgb A1c   Recent Labs (last 2 labs)        Lipid Profile   Recent Labs (last 2 labs)        Thyroid function studies    Recent Labs (last 2 labs)             Anemia work up  [employer] (last 2 labs)                                                Urinalysis  Labs (Brief)  Sepsis Labs   Last Labs        Microbiology          Recent Results (from the past 240 hour(s))    MRSA PCR Screening     Status: Abnormal        Collection Time: 02/27/18  9:36 PM    Result   Value   Ref Range   Status        MRSA by PCR   POSITIVE (A)   NEGATIVE   Final            Comment:           The GeneXpert MRSA Assay (FDA  approved for NASAL specimens  only), is one component of a  comprehensive MRSA colonization  surveillance program. It is not  intended to diagnose MRSA  infection nor to guide  or  monitor treatment for  MRSA infections.  RESULT CALLED TO, READ BACK BY AND VERIFIED WITHLoletha Grayer MERRITT RN 2307 02/27/18 A NAVARRO  Performed at [facility], [place] [address] [place], [place] [address]       MRSA PCR Screening     Status: None        Collection Time: 03/06/18  2:41 PM    Result   Value   Ref Range   Status        MRSA by PCR   NEGATIVE   NEGATIVE   Final            Comment:           The GeneXpert MRSA Assay (FDA  approved for NASAL specimens  only), is one component of a  comprehensive MRSA colonization  surveillance program. It is not  intended to diagnose MRSA  infection nor to guide or  monitor treatment for  MRSA infections.  DELTA CHECK NOTED  Performed at [facility], [place] [address] [place], [place] [address]                Time coordinating discharge: Over 30 minutes     SIGNED:        Shelly Coss, MD        [facility]  03/08/2018, 11:27 AM  Pager [phone]     If 7PM-7AM, please contact night-coverage  [email or web address]  Password [email or web address]           Electronically signed by Shelly Coss, MD at 03/08/2018 11:36 AM               ED to Hosp-Admission (Discharged) on 03/06/2018                  Detailed Report

## 2018-03-17 ENCOUNTER — Inpatient Hospital Stay: Payer: Self-pay

## 2018-04-02 ENCOUNTER — Emergency Department (HOSPITAL_COMMUNITY)
Admission: EM | Admit: 2018-04-02 | Discharge: 2018-04-02 | Disposition: A | Discharge status: Home / Self Care | Payer: Self-pay | Attending: Emergency Medicine | Admitting: Emergency Medicine

## 2018-04-02 ENCOUNTER — Encounter (HOSPITAL_COMMUNITY): Payer: Self-pay | Admitting: Emergency Medicine

## 2018-04-02 ENCOUNTER — Emergency Department (HOSPITAL_COMMUNITY): Payer: Self-pay

## 2018-04-02 DIAGNOSIS — J36 Peritonsillar abscess: Secondary | ICD-10-CM | POA: Insufficient documentation

## 2018-04-02 DIAGNOSIS — Z794 Long term (current) use of insulin: Secondary | ICD-10-CM | POA: Insufficient documentation

## 2018-04-02 DIAGNOSIS — Z87891 Personal history of nicotine dependence: Secondary | ICD-10-CM | POA: Insufficient documentation

## 2018-04-02 DIAGNOSIS — E119 Type 2 diabetes mellitus without complications: Secondary | ICD-10-CM | POA: Insufficient documentation

## 2018-04-02 LAB — CBC WITH DIFFERENTIAL/PLATELET
BASOS ABS: 0 10*3/uL (ref 0.0–0.1)
BASOS PCT: 0 %
Eosinophils Absolute: 0.1 10*3/uL (ref 0.0–0.7)
Eosinophils Relative: 0 %
HEMATOCRIT: 38 % (ref 36.0–46.0)
Hemoglobin: 11.5 g/dL — ABNORMAL LOW (ref 12.0–15.0)
LYMPHS PCT: 16 %
Lymphs Abs: 2.6 10*3/uL (ref 0.7–4.0)
MCH: 23.3 pg — ABNORMAL LOW (ref 26.0–34.0)
MCHC: 30.3 g/dL (ref 30.0–36.0)
MCV: 76.9 fL — AB (ref 78.0–100.0)
Monocytes Absolute: 0.9 10*3/uL (ref 0.1–1.0)
Monocytes Relative: 6 %
NEUTROS ABS: 12.7 10*3/uL — AB (ref 1.7–7.7)
Neutrophils Relative %: 78 %
PLATELETS: 479 10*3/uL — AB (ref 150–400)
RBC: 4.94 MIL/uL (ref 3.87–5.11)
RDW: 16.5 % — ABNORMAL HIGH (ref 11.5–15.5)
WBC: 16.3 10*3/uL — AB (ref 4.0–10.5)

## 2018-04-02 LAB — BASIC METABOLIC PANEL
ANION GAP: 11 (ref 5–15)
BUN: 9 mg/dL (ref 6–20)
CO2: 24 mmol/L (ref 22–32)
Calcium: 9.8 mg/dL (ref 8.9–10.3)
Chloride: 103 mmol/L (ref 101–111)
Creatinine, Ser: 0.91 mg/dL (ref 0.44–1.00)
Glucose, Bld: 139 mg/dL — ABNORMAL HIGH (ref 65–99)
POTASSIUM: 4.1 mmol/L (ref 3.5–5.1)
Sodium: 138 mmol/L (ref 135–145)

## 2018-04-02 LAB — I-STAT BETA HCG BLOOD, ED (MC, WL, AP ONLY): I-stat hCG, quantitative: <5 m[IU]/mL (ref <5)

## 2018-04-02 LAB — GROUP A STREP BY PCR: Group A Strep by PCR: NOT DETECTED

## 2018-04-02 MED ORDER — HYDROCODONE-ACETAMINOPHEN 7.5-325 MG/15ML PO SOLN
10.0000 mL | Freq: Once | ORAL | Status: AC
Start: 2018-04-02 — End: 2018-04-02
  Administered 2018-04-02: 10 mL via ORAL
  Filled 2018-04-02: qty 15

## 2018-04-02 MED ORDER — IOHEXOL 300 MG/ML  SOLN
75.0000 mL | Freq: Once | INTRAMUSCULAR | Status: AC | PRN
  Administered 2018-04-02: 75 mL via INTRAVENOUS

## 2018-04-02 MED ORDER — CLINDAMYCIN HCL 150 MG PO CAPS: 450.0000 mg | ORAL_CAPSULE | Freq: Three times a day (TID) | ORAL | 0 refills | Status: AC

## 2018-04-02 MED ORDER — DEXAMETHASONE SODIUM PHOSPHATE 10 MG/ML IJ SOLN
10.0000 mg | Freq: Once | INTRAMUSCULAR | Status: AC
  Administered 2018-04-02: 10 mg via INTRAVENOUS
  Filled 2018-04-02: qty 1

## 2018-04-02 MED ORDER — LIDOCAINE VISCOUS 2 % MT SOLN
15.0000 mL | Freq: Once | OROMUCOSAL | Status: AC
  Administered 2018-04-02: 15 mL via OROMUCOSAL
  Filled 2018-04-02: qty 15

## 2018-04-02 MED ORDER — HYDROCODONE-ACETAMINOPHEN 7.5-325 MG/15ML PO SOLN: 15.0000 mL | Freq: Four times a day (QID) | ORAL | 0 refills | Status: AC | PRN

## 2018-04-02 MED ORDER — CLINDAMYCIN PHOSPHATE 600 MG/50ML IV SOLN
600.0000 mg | Freq: Once | INTRAVENOUS | Status: AC
  Administered 2018-04-02: 600 mg via INTRAVENOUS
  Filled 2018-04-02: qty 50

## 2018-04-02 NOTE — ED Triage Notes (Signed)
Pt c/o right side sore throat and ear pain since Thursday. Reports fevers last took meds was last night.

## 2018-04-02 NOTE — ED Notes (Signed)
Bed: WTR9 Expected date:  Expected time:  Means of arrival:  Comments: 

## 2018-04-02 NOTE — ED Provider Notes (Signed)
[facility] DEPT Provider Note   CSN: [id number] Arrival date & time: 04/02/18  8502     History   Chief Complaint Chief Complaint  Patient presents with  . Sore Throat  . Otalgia    HPI Dana Davenport is a 27 y.o. female with h/o diabetes on insulin here for evaluation of sore throat x 4 days.  Pain is on right side only.  Associated symptoms: hoarseness, right sided anterior neck swelling, trismus, fever of 101 yesterday.  Aggravating factors include swallowing saliva, palpation of lymph nodes.  Alleviating factors: ibuprofen provides minimal relief of pain and fever.  Denies sick contacts, drooling, shortness of breath, cough.   HPI  Past Medical History:  Diagnosis Date  . Diabetes mellitus without complication [facility])     Patient Active Problem List   Diagnosis Date Noted  . DKA (diabetic ketoacidoses) ([place]) 02/27/2018    Past Surgical History:  Procedure Laterality Date  . NO PAST SURGERIES       OB History    Gravida  0   Para  0   Term  0   Preterm  0   AB  0   Living  0     SAB  0   TAB  0   Ectopic  0   Multiple  0   Live Births               Home Medications    Prior to Admission medications   Medication Sig Start Date End Date Taking? Authorizing Provider  blood glucose meter kit and supplies Dispense based on patient and insurance preference. Use up to four times daily as directed. (FOR ICD-9 250.00, 250.01). 02/28/18   Patrecia Pour, Christean Grief, MD  clindamycin (CLEOCIN) 150 MG capsule Take 3 capsules (450 mg total) by mouth 3 (three) times daily for 7 days. 04/02/18 04/09/18  Kinnie Feil, PA-C  dicyclomine (BENTYL) 10 MG capsule Take 1 capsule (10 mg total) by mouth 4 (four) times daily -  before meals and at bedtime. 03/08/18   Shelly Coss, MD  ferrous sulfate 325 (65 FE) MG tablet Take 1 tablet (325 mg total) by mouth daily with breakfast. 03/09/18   Shelly Coss, MD  HYDROcodone-acetaminophen  (HYCET) 7.5-325 mg/15 ml solution Take 15 mLs by mouth 4 (four) times daily as needed for moderate pain. 04/02/18 04/02/19  Kinnie Feil, PA-C  insulin NPH-regular Human (NOVOLIN 70/30) (70-30) 100 UNIT/ML injection Inject 25 Units into the skin 2 (two) times daily with a meal. 03/08/18   Shelly Coss, MD  insulin regular (NOVOLIN R,HUMULIN R) 100 units/mL injection Use  0-15 units tidwc (correction insulin if needed at home) 03/08/18 03/08/19  Shelly Coss, MD  metFORMIN (GLUCOPHAGE) 500 MG tablet Take 1 tablet (500 mg total) by mouth 2 (two) times daily with a meal. 02/28/18 03/30/18  Doreatha Lew, MD    Family History Family History  Problem Relation Age of Onset  . Kidney Stones Mother   . Anesthesia problems Neg Hx     Social History Social History   Tobacco Use  . Smoking status: Former Smoker    Types: Cigarettes  . Smokeless tobacco: Never Used  Substance Use Topics  . Alcohol use: No    Frequency: Never  . Drug use: No     Allergies   Patient has no known allergies.   Review of Systems Review of Systems  Constitutional: Positive for fever.  HENT: Positive for sore throat,  trouble swallowing and voice change.        Trismus Anterior neck swelling and tenderness   Allergic/Immunologic: Positive for immunocompromised state (diabetes on insulin).  All other systems reviewed and are negative.    Physical Exam Updated Vital Signs BP 117/79 (BP Location: Left Arm)   Pulse 100   Temp 99.2 F (37.3 C) (Oral)   Resp 18   LMP 03/10/2018   SpO2 96%   Physical Exam  Constitutional: She is oriented to person, place, and time. She appears well-developed and well-nourished. No distress.  NAD.  HENT:  Head: Normocephalic and atraumatic.  Right Ear: External ear normal.  Left Ear: External ear normal.  Nose: Nose normal.  Mouth/Throat: Posterior oropharyngeal erythema present. Tonsils are 2+ on the right. Tonsils are 1+ on the left. Tonsillar exudate.    Trismus initially able to fit 2 finger widths between incisors, with coaching patient able to open mouth wider for exam.  Asymmetric right tonsillar hypertrophy, not touching uvula.  Bilateral tonsillar exudates.  Uvula midline.   Moist mucous membranes.  No sublingual or tongue edema or tenderness.  No stridor, drooling.  Tolerating oral secretions.  Eyes: Conjunctivae and EOM are normal. No scleral icterus.  Neck: Normal range of motion. Neck supple.    Diffuse, right-sided submandibular and anterior neck tenderness with no obvious asymmetric anterior neck edema.  Full passive range of motion of the neck without rigidity however patient reports pain with right neck rotation and bend.  Nonpalpable thyroid.  Trachea midline.  Cardiovascular: Normal rate, regular rhythm and normal heart sounds.  No murmur heard. Pulmonary/Chest: Effort normal and breath sounds normal. She has no wheezes.  Musculoskeletal: Normal range of motion. She exhibits no deformity.  Neurological: She is alert and oriented to person, place, and time.  Skin: Skin is warm and dry. Capillary refill takes less than 2 seconds.  Psychiatric: She has a normal mood and affect. Her behavior is normal. Judgment and thought content normal.  Nursing note and vitals reviewed.    ED Treatments / Results  Labs (all labs ordered are listed, but only abnormal results are displayed) Labs Reviewed  CBC WITH DIFFERENTIAL/PLATELET - Abnormal; Notable for the following components:      Result Value   WBC 16.3 (*)    Hemoglobin 11.5 (*)    MCV 76.9 (*)    MCH 23.3 (*)    RDW 16.5 (*)    Platelets 479 (*)    Neutro Abs 12.7 (*)    All other components within normal limits  BASIC METABOLIC PANEL - Abnormal; Notable for the following components:   Glucose, Bld 139 (*)    All other components within normal limits  GROUP A STREP BY PCR  I-STAT BETA HCG BLOOD, ED ([facility], [facility], [facility] ONLY)    EKG None  Radiology Ct Soft Tissue Neck W  Contrast  Result Date: 04/02/2018 CLINICAL DATA:  Right-sided sore throat and ear pain since Thursday. Fevers. EXAM: CT NECK WITH CONTRAST TECHNIQUE: Multidetector CT imaging of the neck was performed using the standard protocol following the bolus administration of intravenous contrast. CONTRAST:  42m OMNIPAQUE IOHEXOL 300 MG/ML  SOLN COMPARISON:  None. FINDINGS: Pharynx and larynx: Mild tonsillar thickening. There is a right peritonsillar low-density collection measuring 15 mm. No laryngeal edema. No retropharyngeal fluid. Salivary glands: Negative Thyroid: Negative Lymph nodes: Jugulodigastric nodal enlargement on the right, considered reactive in this setting. Vascular: Negative Limited intracranial: Negative Visualized orbits: Negative Mastoids and visualized paranasal sinuses:  Clear Skeleton: Negative Upper chest: The apical lungs are clear IMPRESSION: 15 mm right peritonsillar abscess. Electronically Signed   By: Monte Fantasia M.D.   On: 04/02/2018 15:06    Procedures Procedures (including critical care time)  Medications Ordered in ED Medications  lidocaine (XYLOCAINE) 2 % viscous mouth solution 15 mL (15 mLs Mouth/Throat Given 04/02/18 1333)  iohexol (OMNIPAQUE) 300 MG/ML solution 75 mL (75 mLs Intravenous Contrast Given 04/02/18 1436)  clindamycin (CLEOCIN) IVPB 600 mg (0 mg Intravenous Stopped 04/02/18 1617)  dexamethasone (DECADRON) injection 10 mg (10 mg Intravenous Given 04/02/18 1544)  HYDROcodone-acetaminophen (HYCET) 7.5-325 mg/15 ml solution 10 mL (10 mLs Oral Given 04/02/18 1543)     Initial Impression / Assessment and Plan / ED Course  I have reviewed the triage vital signs and the nursing notes.  Pertinent labs & imaging results that were available during my care of the patient were reviewed by me and considered in my medical decision making (see chart for details).    Concern for peritonsillar abscess versus deeper neck infection, strep pharyngitis, viral pharyngitis.   Given asymmetry of tonsils on exam, focal right-sided tenderness, fever will obtain CT soft tissue neck, screening labs.  Viscous lidocaine given for symptom control in the ED.  Final Clinical Impressions(s) / ED Diagnoses   CT and labs reviewed remarkable for 1.5 cm R PTA, WBC 16. Pt tolerating secretions with mostly benign exam. No stridor. Discussed patient with Dr Blenda Nicely (ENT) who recommended decadron and clindamycin IV, discharge and f/u in clinic tomorrow morning for I&D. Discussed hyperglycemia associated with decadron in setting of diabetes, pt to monitor CBG closely over next 24-48 hours. Encouraged oral hydration and soft diet. Pt deemed appropriate for dc at this time. All questions and concerns address prior to dc.  Final diagnoses:  Peritonsillar abscess    ED Discharge Orders        Ordered    clindamycin (CLEOCIN) 150 MG capsule  3 times daily     04/02/18 1641    HYDROcodone-acetaminophen (HYCET) 7.5-325 mg/15 ml solution  4 times daily PRN     04/02/18 1641       Kinnie Feil, PA-C 04/02/18 1717    Tanna Furry, MD 04/09/18 [phone]

## 2018-04-02 NOTE — Discharge Instructions (Signed)
You have a 1.5 cm abscess near tonsil on the right.  Call Dr. Damien Fusi office at 830 tomorrow morning to schedule an appointment, this will have to be drained in the office.  Take antibiotics as prescribed.  Hycet solution for throat discomfort.  You can add liquid ibuprofen or Tylenol for more pain control.  Stay well-hydrated with fluids, soft diet to avoid further pain.

## 2018-04-20 ENCOUNTER — Other Ambulatory Visit: Payer: Self-pay

## 2018-04-20 ENCOUNTER — Emergency Department (HOSPITAL_BASED_OUTPATIENT_CLINIC_OR_DEPARTMENT_OTHER)
Admission: EM | Admit: 2018-04-20 | Discharge: 2018-04-20 | Disposition: A | Discharge status: Home / Self Care | Payer: Self-pay | Attending: Physician Assistant | Admitting: Physician Assistant

## 2018-04-20 ENCOUNTER — Encounter (HOSPITAL_BASED_OUTPATIENT_CLINIC_OR_DEPARTMENT_OTHER): Payer: Self-pay | Admitting: Adult Health

## 2018-04-20 DIAGNOSIS — E1110 Type 2 diabetes mellitus with ketoacidosis without coma: Secondary | ICD-10-CM | POA: Insufficient documentation

## 2018-04-20 DIAGNOSIS — Z79899 Other long term (current) drug therapy: Secondary | ICD-10-CM | POA: Insufficient documentation

## 2018-04-20 DIAGNOSIS — R21 Rash and other nonspecific skin eruption: Secondary | ICD-10-CM | POA: Insufficient documentation

## 2018-04-20 DIAGNOSIS — Z87891 Personal history of nicotine dependence: Secondary | ICD-10-CM | POA: Insufficient documentation

## 2018-04-20 DIAGNOSIS — Z794 Long term (current) use of insulin: Secondary | ICD-10-CM | POA: Insufficient documentation

## 2018-04-20 LAB — CBC WITH DIFFERENTIAL/PLATELET
BASOS PCT: 0 %
Basophils Absolute: 0 10*3/uL (ref 0.0–0.1)
EOS ABS: 0.2 10*3/uL (ref 0.0–0.7)
EOS PCT: 2 %
HEMATOCRIT: 33.6 % — AB (ref 36.0–46.0)
HEMOGLOBIN: 10.8 g/dL — AB (ref 12.0–15.0)
Lymphocytes Relative: 24 %
Lymphs Abs: 2.1 10*3/uL (ref 0.7–4.0)
MCH: 23.5 pg — AB (ref 26.0–34.0)
MCHC: 32.1 g/dL (ref 30.0–36.0)
MCV: 73 fL — AB (ref 78.0–100.0)
Monocytes Absolute: 0.5 10*3/uL (ref 0.1–1.0)
Monocytes Relative: 6 %
NEUTROS ABS: 6 10*3/uL (ref 1.7–7.7)
NEUTROS PCT: 68 %
Platelets: 400 10*3/uL (ref 150–400)
RBC: 4.6 MIL/uL (ref 3.87–5.11)
RDW: 16.4 % — ABNORMAL HIGH (ref 11.5–15.5)
WBC: 8.8 10*3/uL (ref 4.0–10.5)

## 2018-04-20 LAB — COMPREHENSIVE METABOLIC PANEL
ALBUMIN: 4.1 g/dL (ref 3.5–5.0)
ALK PHOS: 90 U/L (ref 38–126)
ALT: 22 U/L (ref 14–54)
ANION GAP: 13 (ref 5–15)
AST: 17 U/L (ref 15–41)
BUN: 9 mg/dL (ref 6–20)
CALCIUM: 9 mg/dL (ref 8.9–10.3)
CHLORIDE: 98 mmol/L — AB (ref 101–111)
CO2: 20 mmol/L — AB (ref 22–32)
CREATININE: 0.77 mg/dL (ref 0.44–1.00)
GFR calc non Af Amer: >60 mL/min (ref >60)
GLUCOSE: 516 mg/dL — AB (ref 65–99)
Potassium: 4 mmol/L (ref 3.5–5.1)
SODIUM: 131 mmol/L — AB (ref 135–145)
Total Bilirubin: 0.4 mg/dL (ref 0.3–1.2)
Total Protein: 7.7 g/dL (ref 6.5–8.1)

## 2018-04-20 LAB — RAPID HIV SCREEN (HIV 1/2 AB+AG)
HIV 1/2 Antibodies: NONREACTIVE
HIV-1 P24 Antigen - HIV24: NONREACTIVE

## 2018-04-20 MED ORDER — DOXYCYCLINE HYCLATE 100 MG PO CAPS: 100.0000 mg | ORAL_CAPSULE | Freq: Two times a day (BID) | ORAL | 0 refills | Status: AC

## 2018-04-20 MED ORDER — PENICILLIN G BENZATHINE 1200000 UNIT/2ML IM SUSP
2.4000 10*6.[IU] | Freq: Once | INTRAMUSCULAR | Status: AC
  Administered 2018-04-20: 2.4 10*6.[IU] via INTRAMUSCULAR
  Filled 2018-04-20: qty 4

## 2018-04-20 MED ORDER — FLUCONAZOLE 100 MG PO TABS
150.0000 mg | ORAL_TABLET | Freq: Once | ORAL | Status: AC
  Administered 2018-04-20: 150 mg via ORAL
  Filled 2018-04-20: qty 1

## 2018-04-20 MED ORDER — DOXYCYCLINE HYCLATE 100 MG PO TABS
100.0000 mg | ORAL_TABLET | Freq: Once | ORAL | Status: AC
  Administered 2018-04-20: 100 mg via ORAL
  Filled 2018-04-20: qty 1

## 2018-04-20 NOTE — ED Triage Notes (Addendum)
Presents with rash to palms and soles of feet as well as white coating to throat, pt states it began yesterday. She denies fevers. Denies single chancres anywhere else as well.  She is currently taking Clindamycin for a throat abscess.

## 2018-04-20 NOTE — ED Notes (Signed)
Glucose 516 RN and MD notified

## 2018-04-20 NOTE — Discharge Instructions (Addendum)
Like we discussed we are unsure what your rash is.  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  Additionally we gave you antibiotics in case it is a tickborne illness.  Will need to follow-up with a primary care physician.  We know that you have been in discussion with the wellness clinic plan please follow-up as soon as possible.  Please record your sugars 3 times daily and use yourr sliding scale at home as instructed.  Please return if you have any  increase in rash, if you have systemic systems such as feeling nausea vomiting or fevers at home.  To find a primary care or specialty doctor please call [phone] or [phone] to access "[place] Find a Doctor Service."  You may also go on the [facility] website at [email or web address]  There are also multiple [facility] practices throughout the [place] that are frequently accepting new patients. You may find a clinic that is close to your home and contact them.  [facility] -  [address] [place] [address] [phone]  [facility] in [place] (also locations in [place] and [place]) -  [address] [place] [address] [phone]  [facility] -  [address] [place] [address] [phone]

## 2018-04-20 NOTE — ED Provider Notes (Signed)
[facility] EMERGENCY DEPARTMENT Provider Note   CSN: [id number] Arrival date & time: 04/20/18  1355     History   Chief Complaint Chief Complaint  Patient presents with  . Rash    HPI Cosima Prentiss is a 27 y.o. female.  HPI   27 year old female presenting with rash.  Patient had recent visit for sore throat.  Patient had been treated with clindamycin.  She is only partly through the course of Clinda mycin.  Patient developed a rash on her palms and soles.  Patient reports that the abscess in her mouth as well as her sore throat have been getting better.  No fevers.  No vaginal discharge.  No nausea no vomiting.  Patient denies any tick exposure.  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  Does not camp.  No tick bites known.  Patient denies any sex with men.  Patient has 1 female partner.  Has never had a female partner.  6:03 PM Will treat patient with penicillin for syphilis.  We will give her doxycycline in case that its a tick borne illness.  Will warn patient if the rash were to spread that she should return immediately especially with any oral involvement.  In addition we will give her Diflucan because of the white coating in the mouth with elevated sugars.  In addition counseled her heavily about taking her medications correctly.  She reports she has not been taking her sliding scale at all.  Counseled her heavily about following up with a primary care physician.   Past Medical History:  Diagnosis Date  . Diabetes mellitus without complication [facility])     Patient Active Problem List   Diagnosis Date Noted  . DKA (diabetic ketoacidoses) ([place]) 02/27/2018    Past Surgical History:  Procedure Laterality Date  . NO PAST SURGERIES       OB History    Gravida  0   Para  0   Term  0   Preterm  0   AB  0   Living  0     SAB  0   TAB  0   Ectopic  0   Multiple  0   Live Births               Home  Medications    Prior to Admission medications   Medication Sig Start Date End Date Taking? Authorizing Provider  clindamycin (CLEOCIN) 300 MG capsule Take 300 mg by mouth 3 (three) times daily.   Yes [provider]  blood glucose meter kit and supplies Dispense based on patient and insurance preference. Use up to four times daily as directed. (FOR ICD-9 250.00, 250.01). 02/28/18   Patrecia Pour, Christean Grief, MD  dicyclomine (BENTYL) 10 MG capsule Take 1 capsule (10 mg total) by mouth 4 (four) times daily -  before meals and at bedtime. 03/08/18   Shelly Coss, MD  doxycycline (VIBRAMYCIN) 100 MG capsule Take 1 capsule (100 mg total) by mouth 2 (two) times daily. 04/20/18   Jamileth Putzier Lyn, MD  ferrous sulfate 325 (65 FE) MG tablet Take 1 tablet (325 mg total) by mouth daily with breakfast. 03/09/18   Shelly Coss, MD  HYDROcodone-acetaminophen (HYCET) 7.5-325 mg/15 ml solution Take 15 mLs by mouth 4 (four) times daily as needed for moderate pain. 04/02/18 04/02/19  Kinnie Feil, PA-C  insulin NPH-regular Human (NOVOLIN 70/30) (70-30) 100 UNIT/ML injection Inject 25 Units  into the skin 2 (two) times daily with a meal. 03/08/18   Shelly Coss, MD  insulin regular (NOVOLIN R,HUMULIN R) 100 units/mL injection Use  0-15 units tidwc (correction insulin if needed at home) 03/08/18 03/08/19  Shelly Coss, MD  metFORMIN (GLUCOPHAGE) 500 MG tablet Take 1 tablet (500 mg total) by mouth 2 (two) times daily with a meal. 02/28/18 03/30/18  Doreatha Lew, MD    Family History Family History  Problem Relation Age of Onset  . Kidney Stones Mother   . Anesthesia problems Neg Hx     Social History Social History   Tobacco Use  . Smoking status: Former Smoker    Types: Cigarettes  . Smokeless tobacco: Never Used  Substance Use Topics  . Alcohol use: No    Frequency: Never  . Drug use: No     Allergies   Patient has no known allergies.   Review of Systems Review of Systems    Constitutional: Negative for activity change, fatigue and fever.  Respiratory: Negative for shortness of breath.   Cardiovascular: Negative for chest pain.  Gastrointestinal: Negative for abdominal pain.  Endocrine: Negative for cold intolerance and heat intolerance.  Genitourinary: Negative for pelvic pain and vaginal discharge.  Skin: Positive for rash.  All other systems reviewed and are negative.    Physical Exam Updated Vital Signs BP (!) 127/95 (BP Location: Right Arm)   Pulse 92   Temp 98 F (36.7 C) (Oral)   Resp 16   Ht 5' 5"  (1.651 m)   Wt 98.4 kg (217 lb)   LMP 04/14/2018 (Exact Date)   SpO2 100%   BMI 36.11 kg/m   Physical Exam  Constitutional: She is oriented to person, place, and time. She appears well-developed and well-nourished.  HENT:  Head: Normocephalic and atraumatic.  Throat has a white exudates.  No evidence of abscess.  Eyes: Right eye exhibits no discharge. Left eye exhibits no discharge.  Cardiovascular: Normal rate.  Pulmonary/Chest: Effort normal.  Neurological: She is oriented to person, place, and time.  Skin: Skin is warm and dry. She is not diaphoretic.  Circular spots of rash on hands and soles.  See pictures.  Psychiatric: She has a normal mood and affect.  Nursing note and vitals reviewed.    ED Treatments / Results  Labs (all labs ordered are listed, but only abnormal results are displayed) Labs Reviewed  COMPREHENSIVE METABOLIC PANEL - Abnormal; Notable for the following components:      Result Value   Sodium 131 (*)    Chloride 98 (*)    CO2 20 (*)    Glucose, Bld 516 (*)    All other components within normal limits  CBC WITH DIFFERENTIAL/PLATELET - Abnormal; Notable for the following components:   Hemoglobin 10.8 (*)    HCT 33.6 (*)    MCV 73.0 (*)    MCH 23.5 (*)    RDW 16.4 (*)    All other components within normal limits  RAPID HIV SCREEN (HIV 1/2 AB+AG)  RPR  HIV 1 RNA QUANT-NO REFLEX-BLD  ROCKY MTN SPOTTED FVR  ABS PNL(IGG+IGM)    EKG None  Radiology No results found.  Procedures Procedures (including critical care time)  Medications Ordered in ED Medications  fluconazole (DIFLUCAN) tablet 150 mg (150 mg Oral Given 04/20/18 1719)  penicillin g benzathine (BICILLIN LA) 1200000 UNIT/2ML injection 2.4 Million Units (2.4 Million Units Intramuscular Given 04/20/18 1722)  doxycycline (VIBRA-TABS) tablet 100 mg (100 mg Oral Given 04/20/18  1719)     Initial Impression / Assessment and Plan / ED Course  I have reviewed the triage vital signs and the nursing notes.  Pertinent labs & imaging results that were available during my care of the patient were reviewed by me and considered in my medical decision making (see chart for details).     27 year old female presenting with rash.  Patient had recent visit for sore throat.  Patient had been treated with clindamycin.  She is only partly through the course of Clinda mycin.  Patient developed a rash on her palms and soles.  Patient reports that the abscess in her mouth as well as her sore throat have been getting better.  No fevers.  No vaginal discharge.  No nausea no vomiting.  Patient denies any tick exposure.  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  Does not camp.  No tick bites known.  Patient denies any sex with men.  Patient has 1 female partner.  Has never had a female partner.    6:05 PM Differential rashes include erythema multiforme versus [facility] spotted fever versus syphilis.  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  However the appearance well taking the clindamycin seems a bit strange.  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  Started the rash after Clinda makes me think more of erythema multiforme, however the rash is only on this palms and soles which be very atypical.  Final Clinical Impressions(s) / ED Diagnoses   Final diagnoses:  Rash    ED Discharge Orders        Ordered     doxycycline (VIBRAMYCIN) 100 MG capsule  2 times daily     04/20/18 1754       Neilan Rizzo, Fredia Sorrow, MD 04/20/18 1805

## 2018-04-20 NOTE — ED Notes (Signed)
Pt given Living Well with Diabetes educational material.

## 2018-04-21 LAB — RPR: RPR: NONREACTIVE

## 2018-04-21 LAB — HIV-1 RNA QUANT-NO REFLEX-BLD
HIV 1 RNA Quant: <20 copies/mL
LOG10 HIV-1 RNA: UNDETERMINED {Log_copies}/mL

## 2018-04-22 LAB — ROCKY MTN SPOTTED FVR ABS PNL(IGG+IGM)
RMSF IGG: NEGATIVE
RMSF IgM: 0.28 index (ref 0.00–0.89)

## 2018-05-11 ENCOUNTER — Encounter (HOSPITAL_COMMUNITY): Payer: Self-pay

## 2018-05-11 ENCOUNTER — Observation Stay (HOSPITAL_COMMUNITY)
Admission: EM | Admit: 2018-05-11 | Discharge: 2018-05-12 | Disposition: A | Discharge status: Home / Self Care | Payer: Self-pay | Attending: Internal Medicine | Admitting: Internal Medicine

## 2018-05-11 ENCOUNTER — Other Ambulatory Visit: Payer: Self-pay

## 2018-05-11 DIAGNOSIS — Z79899 Other long term (current) drug therapy: Secondary | ICD-10-CM | POA: Insufficient documentation

## 2018-05-11 DIAGNOSIS — Z87891 Personal history of nicotine dependence: Secondary | ICD-10-CM | POA: Insufficient documentation

## 2018-05-11 DIAGNOSIS — Z794 Long term (current) use of insulin: Secondary | ICD-10-CM | POA: Insufficient documentation

## 2018-05-11 DIAGNOSIS — E669 Obesity, unspecified: Secondary | ICD-10-CM | POA: Insufficient documentation

## 2018-05-11 DIAGNOSIS — B3731 Acute candidiasis of vulva and vagina: Secondary | ICD-10-CM

## 2018-05-11 DIAGNOSIS — B373 Candidiasis of vulva and vagina: Secondary | ICD-10-CM | POA: Insufficient documentation

## 2018-05-11 DIAGNOSIS — Z6833 Body mass index (BMI) 33.0-33.9, adult: Secondary | ICD-10-CM | POA: Insufficient documentation

## 2018-05-11 DIAGNOSIS — E1310 Other specified diabetes mellitus with ketoacidosis without coma: Secondary | ICD-10-CM

## 2018-05-11 DIAGNOSIS — E1110 Type 2 diabetes mellitus with ketoacidosis without coma: Principal | ICD-10-CM | POA: Insufficient documentation

## 2018-05-11 LAB — GLUCOSE, CAPILLARY
GLUCOSE-CAPILLARY: 168 mg/dL — AB (ref 65–99)
GLUCOSE-CAPILLARY: 148 mg/dL — AB (ref 65–99)
GLUCOSE-CAPILLARY: 123 mg/dL — AB (ref 65–99)
Glucose-Capillary: 254 mg/dL — ABNORMAL HIGH (ref 65–99)
Glucose-Capillary: 218 mg/dL — ABNORMAL HIGH (ref 65–99)
Glucose-Capillary: 204 mg/dL — ABNORMAL HIGH (ref 65–99)
Glucose-Capillary: 193 mg/dL — ABNORMAL HIGH (ref 65–99)
Glucose-Capillary: 144 mg/dL — ABNORMAL HIGH (ref 65–99)
Glucose-Capillary: 321 mg/dL — ABNORMAL HIGH (ref 65–99)

## 2018-05-11 LAB — BASIC METABOLIC PANEL
Anion gap: 13 (ref 5–15)
Anion gap: 9 (ref 5–15)
Anion gap: 9 (ref 5–15)
Anion gap: 10 (ref 5–15)
BUN: 7 mg/dL (ref 6–20)
BUN: <5 mg/dL — ABNORMAL LOW (ref 6–20)
BUN: <5 mg/dL — ABNORMAL LOW (ref 6–20)
BUN: <5 mg/dL — ABNORMAL LOW (ref 6–20)
CALCIUM: 8.7 mg/dL — AB (ref 8.9–10.3)
CHLORIDE: 105 mmol/L (ref 101–111)
CHLORIDE: 113 mmol/L — AB (ref 101–111)
CHLORIDE: 112 mmol/L — AB (ref 101–111)
CHLORIDE: 107 mmol/L (ref 101–111)
CO2: 12 mmol/L — ABNORMAL LOW (ref 22–32)
CO2: 16 mmol/L — AB (ref 22–32)
CO2: 17 mmol/L — AB (ref 22–32)
CO2: 15 mmol/L — AB (ref 22–32)
CREATININE: 0.87 mg/dL (ref 0.44–1.00)
Calcium: 8.5 mg/dL — ABNORMAL LOW (ref 8.9–10.3)
Calcium: 8.4 mg/dL — ABNORMAL LOW (ref 8.9–10.3)
Calcium: 8.6 mg/dL — ABNORMAL LOW (ref 8.9–10.3)
Creatinine, Ser: 0.68 mg/dL (ref 0.44–1.00)
Creatinine, Ser: 0.66 mg/dL (ref 0.44–1.00)
Creatinine, Ser: 0.78 mg/dL (ref 0.44–1.00)
GFR calc non Af Amer: >60 mL/min (ref >60)
GFR calc non Af Amer: >60 mL/min (ref >60)
GFR calc non Af Amer: >60 mL/min (ref >60)
GFR calc non Af Amer: >60 mL/min (ref >60)
Glucose, Bld: 607 mg/dL (ref 65–99)
Glucose, Bld: 201 mg/dL — ABNORMAL HIGH (ref 65–99)
Glucose, Bld: 142 mg/dL — ABNORMAL HIGH (ref 65–99)
Glucose, Bld: 365 mg/dL — ABNORMAL HIGH (ref 65–99)
POTASSIUM: 3 mmol/L — AB (ref 3.5–5.1)
Potassium: 3.7 mmol/L (ref 3.5–5.1)
Potassium: 3.4 mmol/L — ABNORMAL LOW (ref 3.5–5.1)
Potassium: 3.8 mmol/L (ref 3.5–5.1)
SODIUM: 130 mmol/L — AB (ref 135–145)
Sodium: 138 mmol/L (ref 135–145)
Sodium: 138 mmol/L (ref 135–145)
Sodium: 132 mmol/L — ABNORMAL LOW (ref 135–145)

## 2018-05-11 LAB — BLOOD GAS, VENOUS
Acid-base deficit: 12.7 mmol/L — ABNORMAL HIGH (ref 0.0–2.0)
Bicarbonate: 13.5 mmol/L — ABNORMAL LOW (ref 20.0–28.0)
O2 Saturation: 54.1 %
PCO2 VEN: 33.1 mmHg — AB (ref 44.0–60.0)
PO2 VEN: 31.4 mmHg — AB (ref 32.0–45.0)
Patient temperature: 98.6
pH, Ven: 7.235 — ABNORMAL LOW (ref 7.250–7.430)

## 2018-05-11 LAB — HEPATIC FUNCTION PANEL
ALBUMIN: 3.8 g/dL (ref 3.5–5.0)
ALT: 15 U/L (ref 14–54)
AST: 11 U/L — AB (ref 15–41)
Alkaline Phosphatase: 100 U/L (ref 38–126)
Bilirubin, Direct: 0.1 mg/dL (ref 0.1–0.5)
Indirect Bilirubin: 1.3 mg/dL — ABNORMAL HIGH (ref 0.3–0.9)
TOTAL PROTEIN: 7.3 g/dL (ref 6.5–8.1)
Total Bilirubin: 1.4 mg/dL — ABNORMAL HIGH (ref 0.3–1.2)

## 2018-05-11 LAB — URINALYSIS, ROUTINE W REFLEX MICROSCOPIC
Bilirubin Urine: NEGATIVE
HGB URINE DIPSTICK: NEGATIVE
KETONES UR: 80 mg/dL — AB
LEUKOCYTES UA: NEGATIVE
Nitrite: NEGATIVE
PH: 5 (ref 5.0–8.0)
Protein, ur: NEGATIVE mg/dL
Specific Gravity, Urine: 1.028 (ref 1.005–1.030)

## 2018-05-11 LAB — CBC
HCT: 36.5 % (ref 36.0–46.0)
Hemoglobin: 10.8 g/dL — ABNORMAL LOW (ref 12.0–15.0)
MCH: 22.3 pg — ABNORMAL LOW (ref 26.0–34.0)
MCHC: 29.6 g/dL — ABNORMAL LOW (ref 30.0–36.0)
MCV: 75.3 fL — AB (ref 78.0–100.0)
PLATELETS: 341 10*3/uL (ref 150–400)
RBC: 4.85 MIL/uL (ref 3.87–5.11)
RDW: 16.3 % — AB (ref 11.5–15.5)
WBC: 10 10*3/uL (ref 4.0–10.5)

## 2018-05-11 LAB — I-STAT CHEM 8, ED
BUN: 4 mg/dL — ABNORMAL LOW (ref 6–20)
CREATININE: 0.7 mg/dL (ref 0.44–1.00)
Calcium, Ion: 1.21 mmol/L (ref 1.15–1.40)
Chloride: 107 mmol/L (ref 101–111)
GLUCOSE: 488 mg/dL — AB (ref 65–99)
HCT: 38 % (ref 36.0–46.0)
HEMOGLOBIN: 12.9 g/dL (ref 12.0–15.0)
POTASSIUM: 3.9 mmol/L (ref 3.5–5.1)
Sodium: 135 mmol/L (ref 135–145)
TCO2: 13 mmol/L — ABNORMAL LOW (ref 22–32)

## 2018-05-11 LAB — CBG MONITORING, ED
GLUCOSE-CAPILLARY: 577 mg/dL — AB (ref 65–99)
GLUCOSE-CAPILLARY: 341 mg/dL — AB (ref 65–99)
Glucose-Capillary: 441 mg/dL — ABNORMAL HIGH (ref 65–99)

## 2018-05-11 LAB — MRSA PCR SCREENING: MRSA BY PCR: POSITIVE — AB

## 2018-05-11 LAB — I-STAT BETA HCG BLOOD, ED (MC, WL, AP ONLY): I-stat hCG, quantitative: <5 m[IU]/mL (ref <5)

## 2018-05-11 LAB — HEMOGLOBIN A1C
Hgb A1c MFr Bld: 13.2 % — ABNORMAL HIGH (ref 4.8–5.6)
Mean Plasma Glucose: 332.14 mg/dL

## 2018-05-11 LAB — PREGNANCY, URINE: PREG TEST UR: NEGATIVE

## 2018-05-11 LAB — LIPASE, BLOOD: LIPASE: 26 U/L (ref 11–51)

## 2018-05-11 MED ORDER — INSULIN ASPART 100 UNIT/ML ~~LOC~~ SOLN
0.0000 [IU] | Freq: Three times a day (TID) | SUBCUTANEOUS | Status: DC
  Administered 2018-05-12: 8 [IU] via SUBCUTANEOUS
  Administered 2018-05-12: 5 [IU] via SUBCUTANEOUS

## 2018-05-11 MED ORDER — LACTATED RINGERS IV BOLUS
1000.0000 mL | Freq: Once | INTRAVENOUS | Status: AC
Start: 2018-05-11 — End: 2018-05-11
  Administered 2018-05-11: 1000 mL via INTRAVENOUS

## 2018-05-11 MED ORDER — ENOXAPARIN SODIUM 40 MG/0.4ML ~~LOC~~ SOLN
40.0000 mg | SUBCUTANEOUS | Status: DC
  Administered 2018-05-11 – 2018-05-12 (×2): 40 mg via SUBCUTANEOUS
  Filled 2018-05-11 (×2): qty 0.4

## 2018-05-11 MED ORDER — GUAIFENESIN-DM 100-10 MG/5ML PO SYRP
5.0000 mL | ORAL_SOLUTION | ORAL | Status: DC | PRN
  Administered 2018-05-11 – 2018-05-12 (×3): 5 mL via ORAL
  Filled 2018-05-11 (×3): qty 10

## 2018-05-11 MED ORDER — SODIUM CHLORIDE 0.9 % IV SOLN
INTRAVENOUS | Status: DC
  Administered 2018-05-11: 3.8 [IU]/h via INTRAVENOUS
  Filled 2018-05-11: qty 1

## 2018-05-11 MED ORDER — FLUCONAZOLE 150 MG PO TABS
150.0000 mg | ORAL_TABLET | Freq: Once | ORAL | Status: AC
  Administered 2018-05-11: 150 mg via ORAL
  Filled 2018-05-11: qty 1

## 2018-05-11 MED ORDER — INSULIN ASPART 100 UNIT/ML ~~LOC~~ SOLN
0.0000 [IU] | Freq: Every day | SUBCUTANEOUS | Status: DC
  Administered 2018-05-11: 4 [IU] via SUBCUTANEOUS

## 2018-05-11 MED ORDER — INSULIN ASPART 100 UNIT/ML ~~LOC~~ SOLN: 6.0000 [IU] | Freq: Three times a day (TID) | SUBCUTANEOUS | Status: DC

## 2018-05-11 MED ORDER — INSULIN ASPART 100 UNIT/ML ~~LOC~~ SOLN: 0.0000 [IU] | Freq: Three times a day (TID) | SUBCUTANEOUS | Status: DC

## 2018-05-11 MED ORDER — SODIUM CHLORIDE 0.9 % IV BOLUS
1000.0000 mL | Freq: Once | INTRAVENOUS | Status: AC
  Administered 2018-05-11: 1000 mL via INTRAVENOUS

## 2018-05-11 MED ORDER — INSULIN ASPART 100 UNIT/ML ~~LOC~~ SOLN: 0.0000 [IU] | Freq: Every day | SUBCUTANEOUS | Status: DC

## 2018-05-11 MED ORDER — DEXTROSE-NACL 5-0.45 % IV SOLN
INTRAVENOUS | Status: DC
  Administered 2018-05-11: 12:00:00 via INTRAVENOUS

## 2018-05-11 MED ORDER — INSULIN GLARGINE 100 UNIT/ML ~~LOC~~ SOLN
25.0000 [IU] | Freq: Every day | SUBCUTANEOUS | Status: DC
  Administered 2018-05-11: 25 [IU] via SUBCUTANEOUS
  Filled 2018-05-11: qty 0.25

## 2018-05-11 MED ORDER — POTASSIUM CHLORIDE CRYS ER 20 MEQ PO TBCR
40.0000 meq | EXTENDED_RELEASE_TABLET | Freq: Once | ORAL | Status: AC
  Administered 2018-05-11: 40 meq via ORAL
  Filled 2018-05-11: qty 2

## 2018-05-11 MED ORDER — POTASSIUM CHLORIDE 10 MEQ/100ML IV SOLN
10.0000 meq | INTRAVENOUS | Status: AC
  Administered 2018-05-11 (×2): 10 meq via INTRAVENOUS
  Filled 2018-05-11 (×2): qty 100

## 2018-05-11 MED ORDER — SODIUM CHLORIDE 0.9 % IV SOLN
INTRAVENOUS | Status: DC
  Administered 2018-05-11: 09:00:00 via INTRAVENOUS

## 2018-05-11 MED ORDER — FERROUS SULFATE 325 (65 FE) MG PO TABS
325.0000 mg | ORAL_TABLET | Freq: Every day | ORAL | Status: DC
  Administered 2018-05-12: 325 mg via ORAL
  Filled 2018-05-11: qty 1

## 2018-05-11 NOTE — Progress Notes (Signed)
Inpatient Diabetes Program Recommendations  AACE/ADA: New Consensus Statement on Inpatient Glycemic Control (2015)  Target Ranges:  Prepandial:   less than 140 mg/dL      Peak postprandial:   less than 180 mg/dL (1-2 hours)      Critically ill patients:  140 - 180 mg/dL   Lab Results  Component Value Date   GLUCAP 123 (H) 05/11/2018   HGBA1C 13.2 (H) 05/11/2018    Review of Glycemic Control  Diabetes history: DM2 Outpatient Diabetes medications: 70/30 25 units bid, R 0-15 units tidwc, metformin 500 mg bid Current orders for Inpatient glycemic control: IV insulin  CO2 - 17 AG - 9  Gap is closed, but CO2 is still low. Recheck BMET Q4H NPO Familiar with pt from previous admission.  Inpatient Diabetes Program Recommendations:     Lantus 25-35 units Q24H Novolog 0-15 units tidwc and hs Will need meal coverage insulin - Novolog 6 units tidwc CHO mod diet  Spoke with pt regarding her HgbA1C of 13.2%. Pt has been taking prescribed insulin and checking blood sugars, but unable to get appt with [facility]. Has even gone there and waited, but did not see provider.  Will need case management consult for assistance with obtaining a PCP to manage her diabetes.  Continue to follow.  Thank you. Ailene Ardshonda Caliann Leckrone, RD, LDN, CDE Inpatient Diabetes Coordinator [phone]

## 2018-05-11 NOTE — H&P (Signed)
History and Physical    Dana Davenport [facility]:[id number] DOB: August 09, 1991 DOA: 05/11/2018  PCP: Patient, No Pcp Per   Patient coming from: Home    Chief Complaint: Generalized weakness, polyuria  HPI: Dana Davenport is a 27 y.o. female with medical history significant of recently diagnosed diabetes mellitus , recurrent DKA who presents to the emergency department with complaints of generalized weakness, not feeling well and polyuria since last few days.  She has been admitted for DKA twice already in last 3-4 months.  On her last admission, patient was discharged on insulin 70/30 25 units twice a day, regular sliding insulin and metformin.  Patient states that she has been taking her medications as instructed and she was advised to follow-up at [facility].  She says she has tried to follow-up over there but they said they are not accept any more patients .Patient does not have any primary care physician. Patient denies any recent illness or sick contacts.  She says she had a generalized weakness, mild nausea, polyuria and feeling blackout.  She also noted that she has developed increased itchiness around her vagina with white discharge.  During my evaluation, she looked comfortable.  She denies any fever, chills, abdominal pain, dysuria, chest pain, palpitations, dyspnea, nausea, vomiting or diarrhea.  ED Course: Patient was found to have elevated blood glucose with low CO2.  Anion gap not significantly elevated.  Admitted for DKA.  Started on insulin drip and fluids.  Review of Systems: As per HPI otherwise 10 point review of systems negative.    Past Medical History:  Diagnosis Date  . Diabetes mellitus without complication [facility])     Past Surgical History:  Procedure Laterality Date  . NO PAST SURGERIES       reports that she has quit smoking. Her smoking use included cigarettes. She has never used smokeless tobacco. She reports that she does not  drink alcohol or use drugs.  No Known Allergies  Family History  Problem Relation Age of Onset  . Kidney Stones Mother   . Anesthesia problems Neg Hx      Prior to Admission medications   Medication Sig Start Date End Date Taking? Authorizing Provider  ferrous sulfate 325 (65 FE) MG tablet Take 1 tablet (325 mg total) by mouth daily with breakfast. 03/09/18  Yes Jehan Bonano, Tamsen Meek, MD  insulin NPH-regular Human (NOVOLIN 70/30) (70-30) 100 UNIT/ML injection Inject 25 Units into the skin 2 (two) times daily with a meal. 03/08/18  Yes Madissen Wyse, MD  insulin regular (NOVOLIN R,HUMULIN R) 100 units/mL injection Use  0-15 units tidwc (correction insulin if needed at home) Patient taking differently: Use  0-15 units tidwc (correction insulin if needed at home). Sliding scale. 03/08/18 03/08/19 Yes Shelly Coss, MD  metFORMIN (GLUCOPHAGE) 500 MG tablet Take 1 tablet (500 mg total) by mouth 2 (two) times daily with a meal. 02/28/18 05/11/18 Yes Doreatha Lew, MD  blood glucose meter kit and supplies Dispense based on patient and insurance preference. Use up to four times daily as directed. (FOR ICD-9 250.00, 250.01). 02/28/18   Patrecia Pour, Christean Grief, MD  clindamycin (CLEOCIN) 300 MG capsule Take 300 mg by mouth 3 (three) times daily.    [provider]  dicyclomine (BENTYL) 10 MG capsule Take 1 capsule (10 mg total) by mouth 4 (four) times daily -  before meals and at bedtime. Patient not taking: Reported on 05/11/2018 03/08/18   Shelly Coss, MD  doxycycline (VIBRAMYCIN) 100  MG capsule Take 1 capsule (100 mg total) by mouth 2 (two) times daily. Patient not taking: Reported on 05/11/2018 04/20/18   Mackuen, Courteney Lyn, MD  HYDROcodone-acetaminophen (HYCET) 7.5-325 mg/15 ml solution Take 15 mLs by mouth 4 (four) times daily as needed for moderate pain. Patient not taking: Reported on 05/11/2018 04/02/18 04/02/19  Kinnie Feil, PA-C    Physical Exam: Vitals:   05/11/18 0543 05/11/18 0547  05/11/18 0600 05/11/18 0727  BP: 126/87  123/84 139/86  Pulse: 95  99 97  Resp:    13  Temp:  98.8 F (37.1 C)    TempSrc:  Oral    SpO2: 100%  100% 100%    Constitutional: NAD, calm, comfortable,obese Vitals:   05/11/18 0543 05/11/18 0547 05/11/18 0600 05/11/18 0727  BP: 126/87  123/84 139/86  Pulse: 95  99 97  Resp:    13  Temp:  98.8 F (37.1 C)    TempSrc:  Oral    SpO2: 100%  100% 100%   Eyes: PERRL, lids and conjunctivae normal ENMT: Mucous membranes are moist. Posterior pharynx clear of any exudate or lesions.Normal dentition.  Neck: normal, supple, no masses, no thyromegaly Respiratory: clear to auscultation bilaterally, no wheezing, no crackles. Normal respiratory effort. No accessory muscle use.  Cardiovascular: Regular rate and rhythm, no murmurs / rubs / gallops. No extremity edema. 2+ pedal pulses. No carotid bruits.  Abdomen: no tenderness, no masses palpated. No hepatosplenomegaly. Bowel sounds positive.  Musculoskeletal: no clubbing / cyanosis. No joint deformity upper and lower extremities. Good ROM, no contractures. Normal muscle tone.  Skin: no rashes, lesions, ulcers. No induration Neurologic: CN 2-12 grossly intact. Sensation intact, DTR normal. Strength 5/5 in all 4.  Psychiatric: Normal judgment and insight. Alert and oriented x 3. Normal mood.   Foley Catheter:None  Labs on Admission: I have personally reviewed following labs and imaging studies  CBC: Recent Labs  Lab 05/11/18 0554 05/11/18 0714  WBC 10.0  --   HGB 10.8* 12.9  HCT 36.5 38.0  MCV 75.3*  --   PLT 341  --    Basic Metabolic Panel: Recent Labs  Lab 05/11/18 0554 05/11/18 0714  NA 130* 135  K 3.7 3.9  CL 105 107  CO2 12*  --   GLUCOSE 607* 488*  BUN 7 4*  CREATININE 0.87 0.70  CALCIUM 8.7*  --    GFR: CrCl cannot be calculated (Unknown ideal weight.). Liver Function Tests: Recent Labs  Lab 05/11/18 0554  AST 11*  ALT 15  ALKPHOS 100  BILITOT 1.4*  PROT 7.3    ALBUMIN 3.8   Recent Labs  Lab 05/11/18 0554  LIPASE 26   No results for input(s): AMMONIA in the last 168 hours. Coagulation Profile: No results for input(s): INR, PROTIME in the last 168 hours. Cardiac Enzymes: No results for input(s): CKTOTAL, CKMB, CKMBINDEX, TROPONINI in the last 168 hours. BNP (last 3 results) No results for input(s): PROBNP in the last 8760 hours. HbA1C: No results for input(s): HGBA1C in the last 72 hours. CBG: Recent Labs  Lab 05/11/18 0542 05/11/18 0720  GLUCAP 577* 441*   Lipid Profile: No results for input(s): CHOL, HDL, LDLCALC, TRIG, CHOLHDL, LDLDIRECT in the last 72 hours. Thyroid Function Tests: No results for input(s): TSH, T4TOTAL, FREET4, T3FREE, THYROIDAB in the last 72 hours. Anemia Panel: No results for input(s): VITAMINB12, FOLATE, FERRITIN, TIBC, IRON, RETICCTPCT in the last 72 hours. Urine analysis:    Component Value Date/Time  COLORURINE COLORLESS (A) 05/11/2018 [place] 05/11/2018 0557   LABSPEC 1.028 05/11/2018 0557   PHURINE 5.0 05/11/2018 0557   GLUCOSEU >=500 (A) 05/11/2018 0557   HGBUR NEGATIVE 05/11/2018 0557   BILIRUBINUR NEGATIVE 05/11/2018 0557   KETONESUR 80 (A) 05/11/2018 0557   PROTEINUR NEGATIVE 05/11/2018 0557   UROBILINOGEN 0.2 01/24/2012 2110   NITRITE NEGATIVE 05/11/2018 0557   LEUKOCYTESUR NEGATIVE 05/11/2018 0557    Radiological Exams on Admission: No results found.   Assessment/Plan Principal Problem:   DKA (diabetic ketoacidoses) (HCC) Active Problems:   Vaginal candidiasis   Obesity  DKA: Recurrent problem.  Patient is on insulin and metformin at home.  Says that she has been taking the insulin and metformin as instructed.  Does not have a PCP to follow-up.  Needs to address this issue on discharge. Started on DKA protocol.  We will keep n.p.o. for now. Will check BMP every four hours.Will check hemoglobin A1c level. She will benefit with follow-up with endocrinology as  an outpatient.  Candida vulvovaginitis: Secondary to uncontrolled diabetes.  She had this issue in the past as well.  We will treat her with single dose of fluconazole for now.  Obesity: Educated on the importance of diet and exercise for proper diabetic control.  Anemia: Iron studies on last admission showed low iron.  She was  started on iron supplementation which will continue.    Severity of Illness: The appropriate patient status for this patient is OBSERVATION.      DVT prophylaxis: Lovenox Code Status: Full Family Communication: None present at the bedside Consults called: None     Shelly Coss MD [facility] Pager [phone]  If 7PM-7AM, please contact night-coverage [email or web address] Password [email or web address]  05/11/2018, 8:19 AM

## 2018-05-11 NOTE — ED Triage Notes (Signed)
Brought in by EMS from home c/o hyperglycemia, per EMS pt blood sugar was 578. Pt stated she was been 500-600 for over a week now and was been trying to control her sugar with insulin and metformin as prescribe. Denies nausea and vomitting.

## 2018-05-11 NOTE — ED Provider Notes (Signed)
[facility] DEPT Provider Note  CSN: [id number] Arrival date & time: 05/11/18 6759  Chief Complaint(s) Hyperglycemia  HPI Dana Davenport is a 27 y.o. female recently diagnosed with diabetes presents to the emergency department with uncontrolled blood sugars.  Patient reports that for the last week her blood sugars have been around 600.  She reports that she is on 7030 of NovoLog and sliding scale insulin as well as metformin.  She states that she has been compliant with her medication.  Denies any recent fevers or infections.  Denies any alcohol use or drug use.  She is endorsing polydipsia, polyuria, abdominal discomfort.  Mild nausea without vomiting.  No chest pain or shortness of breath.  No dysuria.  No diarrhea.  HPI  Past Medical History Past Medical History:  Diagnosis Date  . Diabetes mellitus without complication [facility])    Patient Active Problem List   Diagnosis Date Noted  . DKA (diabetic ketoacidoses) ([place]) 02/27/2018   Home Medication(s) Prior to Admission medications   Medication Sig Start Date End Date Taking? Authorizing Provider  blood glucose meter kit and supplies Dispense based on patient and insurance preference. Use up to four times daily as directed. (FOR ICD-9 250.00, 250.01). 02/28/18   Patrecia Pour, Christean Grief, MD  clindamycin (CLEOCIN) 300 MG capsule Take 300 mg by mouth 3 (three) times daily.    [provider]  dicyclomine (BENTYL) 10 MG capsule Take 1 capsule (10 mg total) by mouth 4 (four) times daily -  before meals and at bedtime. 03/08/18   Shelly Coss, MD  doxycycline (VIBRAMYCIN) 100 MG capsule Take 1 capsule (100 mg total) by mouth 2 (two) times daily. 04/20/18   Mackuen, Courteney Lyn, MD  ferrous sulfate 325 (65 FE) MG tablet Take 1 tablet (325 mg total) by mouth daily with breakfast. 03/09/18   Shelly Coss, MD  HYDROcodone-acetaminophen (HYCET) 7.5-325 mg/15 ml solution Take 15 mLs by mouth 4 (four) times daily  as needed for moderate pain. 04/02/18 04/02/19  Kinnie Feil, PA-C  insulin NPH-regular Human (NOVOLIN 70/30) (70-30) 100 UNIT/ML injection Inject 25 Units into the skin 2 (two) times daily with a meal. 03/08/18   Shelly Coss, MD  insulin regular (NOVOLIN R,HUMULIN R) 100 units/mL injection Use  0-15 units tidwc (correction insulin if needed at home) 03/08/18 03/08/19  Shelly Coss, MD  metFORMIN (GLUCOPHAGE) 500 MG tablet Take 1 tablet (500 mg total) by mouth 2 (two) times daily with a meal. 02/28/18 03/30/18  Doreatha Lew, MD                                                                                                                                    Past Surgical History Past Surgical History:  Procedure Laterality Date  . NO PAST SURGERIES     Family History Family History  Problem Relation Age of Onset  . Kidney Stones Mother   .  Anesthesia problems Neg Hx     Social History Social History   Tobacco Use  . Smoking status: Former Smoker    Types: Cigarettes  . Smokeless tobacco: Never Used  Substance Use Topics  . Alcohol use: No    Frequency: Never  . Drug use: No   Allergies Patient has no known allergies.  Review of Systems Review of Systems All other systems are reviewed and are negative for acute change except as noted in the HPI  Physical Exam Vital Signs  I have reviewed the triage vital signs BP 126/87   Pulse 95   Temp 98.8 F (37.1 C) (Oral)   LMP 04/14/2018 (Exact Date)   SpO2 100%   Physical Exam  Constitutional: She is oriented to person, place, and time. She appears well-developed and well-nourished. No distress.  HENT:  Head: Normocephalic and atraumatic.  Nose: Nose normal.  Eyes: Pupils are equal, round, and reactive to light. Conjunctivae and EOM are normal. Right eye exhibits no discharge. Left eye exhibits no discharge. No scleral icterus.  Neck: Normal range of motion. Neck supple.  Cardiovascular: Normal rate and regular  rhythm. Exam reveals no gallop and no friction rub.  No murmur heard. Pulmonary/Chest: Effort normal and breath sounds normal. No stridor. No respiratory distress. She has no rales.  Abdominal: Soft. She exhibits no distension. There is tenderness (discomfort) in the periumbilical area, suprapubic area and left lower quadrant. There is no rigidity, no rebound, no guarding and no CVA tenderness.  Musculoskeletal: She exhibits no edema or tenderness.  Neurological: She is alert and oriented to person, place, and time.  Skin: Skin is warm and dry. No rash noted. She is not diaphoretic. No erythema.  Psychiatric: She has a normal mood and affect.  Vitals reviewed.   ED Results and Treatments Labs (all labs ordered are listed, but only abnormal results are displayed) Labs Reviewed  BASIC METABOLIC PANEL - Abnormal; Notable for the following components:      Result Value   Sodium 130 (*)    CO2 12 (*)    Glucose, Bld 607 (*)    Calcium 8.7 (*)    All other components within normal limits  CBC - Abnormal; Notable for the following components:   Hemoglobin 10.8 (*)    MCV 75.3 (*)    MCH 22.3 (*)    MCHC 29.6 (*)    RDW 16.3 (*)    All other components within normal limits  URINALYSIS, ROUTINE W REFLEX MICROSCOPIC - Abnormal; Notable for the following components:   Color, Urine COLORLESS (*)    Glucose, UA >=500 (*)    Ketones, ur 80 (*)    Bacteria, UA RARE (*)    All other components within normal limits  BLOOD GAS, VENOUS - Abnormal; Notable for the following components:   pH, Ven 7.235 (*)    pCO2, Ven 33.1 (*)    pO2, Ven 31.4 (*)    Bicarbonate 13.5 (*)    Acid-base deficit 12.7 (*)    All other components within normal limits  HEPATIC FUNCTION PANEL - Abnormal; Notable for the following components:   AST 11 (*)    Total Bilirubin 1.4 (*)    Indirect Bilirubin 1.3 (*)    All other components within normal limits  CBG MONITORING, ED - Abnormal; Notable for the following  components:   Glucose-Capillary 577 (*)    All other components within normal limits  I-STAT CHEM 8, ED - Abnormal; Notable for  the following components:   BUN 4 (*)    Glucose, Bld 488 (*)    TCO2 13 (*)    All other components within normal limits  PREGNANCY, URINE  LIPASE, BLOOD  CBG MONITORING, ED  I-STAT BETA HCG BLOOD, ED ([facility], [facility], [facility] ONLY)                                                                                                                         EKG  EKG Interpretation  Date/Time:    Ventricular Rate:    PR Interval:    QRS Duration:   QT Interval:    QTC Calculation:   R Axis:     Text Interpretation:        Radiology No results found. Pertinent labs & imaging results that were available during my care of the patient were reviewed by me and considered in my medical decision making (see chart for details).  Medications Ordered in ED Medications  insulin regular (NOVOLIN R,HUMULIN R) 100 Units in sodium chloride 0.9 % 100 mL (1 Units/mL) infusion (has no administration in time range)  dextrose 5 %-0.45 % sodium chloride infusion (has no administration in time range)  potassium chloride 10 mEq in 100 mL IVPB (10 mEq Intravenous New Bag/Given 05/11/18 0658)  lactated ringers bolus 1,000 mL (has no administration in time range)  sodium chloride 0.9 % bolus 1,000 mL (1,000 mLs Intravenous New Bag/Given 05/11/18 0617)  sodium chloride 0.9 % bolus 1,000 mL (0 mLs Intravenous Stopped 05/11/18 0708)                                                                                                                                    Procedures Procedures CRITICAL CARE Performed by: Grayce Sessions Cardama Total critical care time: 30 minutes Critical care time was exclusive of separately billable procedures and treating other patients. Critical care was necessary to treat or prevent imminent or life-threatening deterioration. Critical care was time spent personally by me on  the following activities: development of treatment plan with patient and/or surrogate as well as nursing, discussions with consultants, evaluation of patient's response to treatment, examination of patient, obtaining history from patient or surrogate, ordering and performing treatments and interventions, ordering and review of laboratory studies, ordering and review of radiographic studies, pulse oximetry and re-evaluation of patient's condition.   (including critical care time)  Medical Decision Making /  ED Course I have reviewed the nursing notes for this encounter and the patient's prior records (if available in EHR or on provided paperwork).    Presentation consistent with diabetic ketoacidosis.  Multiple IV fluid boluses given.  Insulin drip started.  Will admit for further management.    Final Clinical Impression(s) / ED Diagnoses Final diagnoses:  Diabetic ketoacidosis without coma associated with other specified diabetes mellitus ([place])      This chart was dictated using voice recognition software.  Despite best efforts to proofread,  errors can occur which can change the documentation meaning.   Fatima Blank, MD 05/11/18 [id number]

## 2018-05-12 LAB — BASIC METABOLIC PANEL
ANION GAP: 13 (ref 5–15)
BUN: <5 mg/dL — ABNORMAL LOW (ref 6–20)
CALCIUM: 8.9 mg/dL (ref 8.9–10.3)
CHLORIDE: 107 mmol/L (ref 101–111)
CO2: 15 mmol/L — AB (ref 22–32)
Creatinine, Ser: 0.8 mg/dL (ref 0.44–1.00)
GFR calc non Af Amer: >60 mL/min (ref >60)
GLUCOSE: 253 mg/dL — AB (ref 65–99)
Potassium: 3.5 mmol/L (ref 3.5–5.1)
Sodium: 135 mmol/L (ref 135–145)

## 2018-05-12 LAB — GLUCOSE, CAPILLARY
Glucose-Capillary: 233 mg/dL — ABNORMAL HIGH (ref 65–99)
Glucose-Capillary: 269 mg/dL — ABNORMAL HIGH (ref 65–99)

## 2018-05-12 MED ORDER — INSULIN NPH ISOPHANE & REGULAR (70-30) 100 UNIT/ML ~~LOC~~ SUSP: 35.0000 [IU] | Freq: Two times a day (BID) | SUBCUTANEOUS | 0 refills | Status: AC

## 2018-05-12 MED ORDER — METFORMIN HCL 1000 MG PO TABS: 1000.0000 mg | ORAL_TABLET | Freq: Two times a day (BID) | ORAL | 0 refills | Status: AC

## 2018-05-12 MED ORDER — INSULIN ASPART PROT & ASPART (70-30 MIX) 100 UNIT/ML ~~LOC~~ SUSP
25.0000 [IU] | Freq: Two times a day (BID) | SUBCUTANEOUS | Status: DC
  Administered 2018-05-12: 25 [IU] via SUBCUTANEOUS
  Filled 2018-05-12: qty 10

## 2018-05-12 MED ORDER — INSULIN NPH ISOPHANE & REGULAR (70-30) 100 UNIT/ML ~~LOC~~ SUSP: 30.0000 [IU] | Freq: Two times a day (BID) | SUBCUTANEOUS | 1 refills | Status: DC

## 2018-05-12 MED ORDER — INSULIN ASPART 100 UNIT/ML ~~LOC~~ SOLN: 6.0000 [IU] | Freq: Three times a day (TID) | SUBCUTANEOUS | Status: DC

## 2018-05-12 NOTE — Care Management Note (Signed)
Case Management Note  Patient Details  Name: Dana Davenport MRN: [id number] Date of Birth: 1991-06-05  Subjective/Objective:  DKA                 Action/Plan: Pt [facility] need [facility]. Pt is aware.    Expected Discharge Date:  05/12/18               Expected Discharge Plan:  Home/Self Care  In-House Referral:     Discharge planning Services  CM Consult  Post Acute Care Choice:    Choice offered to:     DME Arranged:    DME Agency:     HH Arranged:    HH Agency:     Status of Service:  Completed, signed off  If discussed at [employer] Length of Stay Meetings, dates discussed:    Additional CommentsGeni Bers:  Ryun Velez, RN 05/12/2018, 3:01 PM

## 2018-05-12 NOTE — Progress Notes (Signed)
Inpatient Diabetes Program Recommendations  AACE/ADA: New Consensus Statement on Inpatient Glycemic Control (2015)  Target Ranges:  Prepandial:   less than 140 mg/dL      Peak postprandial:   less than 180 mg/dL (1-2 hours)      Critically ill patients:  140 - 180 mg/dL   Lab Results  Component Value Date   GLUCAP 269 (H) 05/12/2018   HGBA1C 13.2 (H) 05/11/2018    Review of Glycemic Control  Spoke with pt about diagnosis of DM. Pt states she tried to get appt at [facility] and has already filled out paperwork, but they are not taking appt for new pts until 05/22/2018. Discussed HgbA1C and importance of getting blood sugars controlled and goal of 7%. Pt states her blood sugars were running > 600 mg/dL with 16/1070/30 25 units bid. Has not started an exercise program yet. 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 She can improve on diet and will try to limit serving sizes and choose healthier foods. States she knows importance of getting PCP to manage her diabetes. States she will call [facility] on 6/17 for appt.   Inpatient Diabetes Program Recommendations:   For Discharge:  Novolin 70/30 35 units bid Metformin 1000 mg bid Novolin R 0-15 units tidwc  Discussed with RN.  Thank you. Ailene Ardshonda Ilyas Lipsitz, RD, LDN, CDE Inpatient Diabetes Coordinator [phone]

## 2018-05-14 NOTE — Discharge Summary (Signed)
[facility] Discharge Summary   Patient: Dana Davenport EYC:[id number]   PCP: Patient, No Pcp Per DOB: Jun 21, 1991   Date of admission: 05/11/2018   Date of discharge: 05/12/2018    Discharge Diagnoses:  Principal Problem:   DKA (diabetic ketoacidoses) ([place]) Active Problems:   Vaginal candidiasis   Obesity   Admitted From: home Disposition:  home  Recommendations for Outpatient Follow-up:  1. Please follow-up with PCP in 1 week.  Follow-up [facility] Follow up on 05/22/2018.   Specialty:  Internal Medicine Why:  Please call for a follow up visit on June 17th. [phone] Contact information: [place] [phone]         Diet recommendation: home  Activity: The patient is advised to gradually reintroduce usual activities.  Discharge Condition: good  Code Status: full code  History of present illness: As per the H and P dictated on admission, "Dana Davenport is a 27 y.o. female with medical history significant of recently diagnosed diabetes mellitus , recurrent DKA who presents to the emergency department with complaints of generalized weakness, not feeling well and polyuria since last few days.  She has been admitted for DKA twice already in last 3-4 months.  On her last admission, patient was discharged on insulin 70/30 25 units twice a day, regular sliding insulin and metformin.  Patient states that she has been taking her medications as instructed and she was advised to follow-up at [facility].  She says she has tried to follow-up over there but they said they are not accept any more patients .Patient does not have any primary care physician. Patient denies any recent illness or sick contacts.  She says she had a generalized weakness, mild nausea, polyuria and feeling blackout.  She also noted that she has developed increased itchiness around her vagina  with white discharge.  During my evaluation, she looked comfortable.  She denies any fever, chills, abdominal pain, dysuria, chest pain, palpitations, dyspnea, nausea, vomiting or diarrhea."  Hospital Course:  Summary of her active problems in the hospital is as following. DKA: Recurrent problem.  Patient is on insulin and metformin at home.  Says that she has been taking the insulin and metformin as instructed.  Does not have a PCP to follow-up.  Needs to address this issue on discharge. Started on DKA protocol.    Anion gap closed. A1c significantly risen from prior. Diabetes coordinator was consulted. Plan is to discharge the patient with higher dose of insulin from 25units to 30 units along with sliding scale insulin.  Candida vulvovaginitis: Secondary to uncontrolled diabetes.  She had this issue in the past as well.  We will treat her with single dose of fluconazole for now.  Obesity: Educated on the importance of diet and exercise for proper diabetic control.  Anemia: Iron studies on last admission showed low iron.  She was  started on iron supplementation which will continue.  All other chronic medical condition were stable during the hospitalization.  Patient was ambulatory without any assistance. On the day of the discharge the patient's vitals were stable , and no other acute medical condition were reported by patient. the patient was felt safe to be discharge at home with family.  Consultants: none Procedures: none  DISCHARGE MEDICATION: Allergies as of 05/12/2018   No Known Allergies     Medication List    TAKE these medications  blood glucose meter kit and supplies Dispense based on patient and insurance preference. Use up to four times daily as directed. (FOR ICD-9 250.00, 250.01).   clindamycin 300 MG capsule Commonly known as:  CLEOCIN Take 300 mg by mouth 3 (three) times daily.   dicyclomine 10 MG capsule Commonly known as:  BENTYL Take 1 capsule (10 mg  total) by mouth 4 (four) times daily -  before meals and at bedtime.   doxycycline 100 MG capsule Commonly known as:  VIBRAMYCIN Take 1 capsule (100 mg total) by mouth 2 (two) times daily.   ferrous sulfate 325 (65 FE) MG tablet Take 1 tablet (325 mg total) by mouth daily with breakfast.   HYDROcodone-acetaminophen 7.5-325 mg/15 ml solution Commonly known as:  HYCET Take 15 mLs by mouth 4 (four) times daily as needed for moderate pain.   insulin NPH-regular Human (70-30) 100 UNIT/ML injection Commonly known as:  NOVOLIN 70/30 Inject 35 Units into the skin 2 (two) times daily with a meal. What changed:  how much to take   insulin regular 100 units/mL injection Commonly known as:  NOVOLIN R,HUMULIN R Use  0-15 units tidwc (correction insulin if needed at home) What changed:  additional instructions   metFORMIN 1000 MG tablet Commonly known as:  GLUCOPHAGE Take 1 tablet (1,000 mg total) by mouth 2 (two) times daily with a meal. What changed:    medication strength  how much to take      No Known Allergies Discharge Instructions    Diet - low sodium heart healthy   Complete by:  As directed    Discharge instructions   Complete by:  As directed    It is important that you read following instructions as well as go over your medication list with RN to help you understand your care after this hospitalization.  Discharge Instructions: Please follow-up with PCP in one week  Please request your primary care physician to go over all Hospital Tests and Procedure/Radiological results at the follow up,  Please get all Hospital records sent to your PCP by signing hospital release before you go home.   Do not take more than prescribed Pain, Sleep and Anxiety Medications. You were cared for by a hospitalist during your hospital stay. If you have any questions about your discharge medications or the care you received while you were in the hospital after you are discharged, you can call  the unit and ask to speak with the hospitalist on call if the hospitalist that took care of you is not available.  Once you are discharged, your primary care physician will handle any further medical issues. Please note that NO REFILLS for any discharge medications will be authorized once you are discharged, as it is imperative that you return to your primary care physician (or establish a relationship with a primary care physician if you do not have one) for your aftercare needs so that they can reassess your need for medications and monitor your lab values. You Must read complete instructions/literature along with all the possible adverse reactions/side effects for all the Medicines you take and that have been prescribed to you. Take any new Medicines after you have completely understood and accept all the possible adverse reactions/side effects. Wear Seat belts while driving. If you have smoked or chewed Tobacco in the last 2 yrs please stop smoking and/or stop any Recreational drug use.   Increase activity slowly   Complete by:  As directed  Discharge Exam: Filed Weights   05/12/18 0505  Weight: 90.1 kg (198 lb 9.6 oz)   Vitals:   05/12/18 0505 05/12/18 1304  BP: 122/81 123/69  Pulse: 95 99  Resp: 16 20  Temp: 99.1 F (37.3 C) 98.9 F (37.2 C)  SpO2: 100% 100%   General: Appear in no distress, no Rash; Oral Mucosa moist. Cardiovascular: S1 and S2 Present, no Murmur, no JVD Respiratory: Bilateral Air entry present and Clear to Auscultation, no Crackles, no wheezes Abdomen: Bowel Sound present, Soft and no tenderness Extremities: no Pedal edema, no calf tenderness Neurology: Grossly no focal neuro deficit.  The results of significant diagnostics from this hospitalization (including imaging, microbiology, ancillary and laboratory) are listed below for reference.    Significant Diagnostic Studies: No results found.  Microbiology: Recent Results (from the past 240 hour(s))    MRSA PCR Screening     Status: Abnormal   Collection Time: 05/11/18  9:57 AM  Result Value Ref Range Status   MRSA by PCR POSITIVE (A) NEGATIVE Final    Comment:        The GeneXpert MRSA Assay (FDA approved for NASAL specimens only), is one component of a comprehensive MRSA colonization surveillance program. It is not intended to diagnose MRSA infection nor to guide or monitor treatment for MRSA infections. RESULT CALLED TO, READ BACK BY AND VERIFIED WITH: DILLON,S @ 7530 ON [id number] BY POTEAT,S Performed at [facility] [address] [place], [place] [address]      Labs: CBC: Recent Labs  Lab 05/11/18 0554 05/11/18 0714  WBC 10.0  --   HGB 10.8* 12.9  HCT 36.5 38.0  MCV 75.3*  --   PLT 341  --    Basic Metabolic Panel: Recent Labs  Lab 05/11/18 0554 05/11/18 0714 05/11/18 1204 05/11/18 1544 05/11/18 2049 05/12/18 0851  NA 130* 135 138 138 132* 135  K 3.7 3.9 3.4* 3.0* 3.8 3.5  CL 105 107 113* 112* 107 107  CO2 12*  --  16* 17* 15* 15*  GLUCOSE 607* 488* 201* 142* 365* 253*  BUN 7 4* <5* <5* <5* <5*  CREATININE 0.87 0.70 0.68 0.66 0.78 0.80  CALCIUM 8.7*  --  8.5* 8.4* 8.6* 8.9   Liver Function Tests: Recent Labs  Lab 05/11/18 0554  AST 11*  ALT 15  ALKPHOS 100  BILITOT 1.4*  PROT 7.3  ALBUMIN 3.8   Recent Labs  Lab 05/11/18 0554  LIPASE 26   CBG: Recent Labs  Lab 05/11/18 1554 05/11/18 1655 05/11/18 2122 05/12/18 0747 05/12/18 1120  GLUCAP 123* 144* 321* 233* 269*   Time spent: 35 minutes  Signed:  Berle Mull  [facility] 05/12/2018 , 4:41 PM

## 2019-07-23 ENCOUNTER — Emergency Department (HOSPITAL_COMMUNITY)
Admission: EM | Admit: 2019-07-23 | Discharge: 2019-07-23 | Disposition: A | Discharge status: Home / Self Care | Payer: Self-pay | Attending: Emergency Medicine | Admitting: Emergency Medicine

## 2019-07-23 ENCOUNTER — Other Ambulatory Visit: Payer: Self-pay

## 2019-07-23 ENCOUNTER — Encounter (HOSPITAL_COMMUNITY): Payer: Self-pay

## 2019-07-23 DIAGNOSIS — R739 Hyperglycemia, unspecified: Secondary | ICD-10-CM

## 2019-07-23 DIAGNOSIS — F17210 Nicotine dependence, cigarettes, uncomplicated: Secondary | ICD-10-CM | POA: Insufficient documentation

## 2019-07-23 DIAGNOSIS — Z79899 Other long term (current) drug therapy: Secondary | ICD-10-CM | POA: Insufficient documentation

## 2019-07-23 DIAGNOSIS — Z794 Long term (current) use of insulin: Secondary | ICD-10-CM | POA: Insufficient documentation

## 2019-07-23 DIAGNOSIS — E1165 Type 2 diabetes mellitus with hyperglycemia: Secondary | ICD-10-CM | POA: Insufficient documentation

## 2019-07-23 LAB — CBG MONITORING, ED
Glucose-Capillary: >600 mg/dL (ref 70–99)
Glucose-Capillary: 382 mg/dL — ABNORMAL HIGH (ref 70–99)

## 2019-07-23 LAB — CBC
HCT: 42.8 % (ref 36.0–46.0)
Hemoglobin: 13.2 g/dL (ref 12.0–15.0)
MCH: 25.3 pg — ABNORMAL LOW (ref 26.0–34.0)
MCHC: 30.8 g/dL (ref 30.0–36.0)
MCV: 82 fL (ref 80.0–100.0)
Platelets: 307 10*3/uL (ref 150–400)
RBC: 5.22 MIL/uL — ABNORMAL HIGH (ref 3.87–5.11)
RDW: 15 % (ref 11.5–15.5)
WBC: 6.9 10*3/uL (ref 4.0–10.5)
nRBC: 0 % (ref 0.0–0.2)

## 2019-07-23 LAB — BLOOD GAS, VENOUS
Acid-Base Excess: 2 mmol/L (ref 0.0–2.0)
Bicarbonate: 28.1 mmol/L — ABNORMAL HIGH (ref 20.0–28.0)
O2 Saturation: 22.9 %
Patient temperature: 98.6
pCO2, Ven: 52.3 mmHg (ref 44.0–60.0)
pH, Ven: 7.35 (ref 7.250–7.430)

## 2019-07-23 LAB — BASIC METABOLIC PANEL
Anion gap: 15 (ref 5–15)
BUN: 8 mg/dL (ref 6–20)
CO2: 25 mmol/L (ref 22–32)
Calcium: 10.1 mg/dL (ref 8.9–10.3)
Chloride: 89 mmol/L — ABNORMAL LOW (ref 98–111)
Creatinine, Ser: 0.88 mg/dL (ref 0.44–1.00)
GFR calc Af Amer: >60 mL/min (ref >60)
GFR calc non Af Amer: >60 mL/min (ref >60)
Glucose, Bld: 615 mg/dL (ref 70–99)
Potassium: 4 mmol/L (ref 3.5–5.1)
Sodium: 129 mmol/L — ABNORMAL LOW (ref 135–145)

## 2019-07-23 LAB — URINALYSIS, ROUTINE W REFLEX MICROSCOPIC
Bacteria, UA: NONE SEEN
Bilirubin Urine: NEGATIVE
Glucose, UA: >=500 mg/dL — AB
Hgb urine dipstick: NEGATIVE
Ketones, ur: 20 mg/dL — AB
Leukocytes,Ua: NEGATIVE
Nitrite: NEGATIVE
Protein, ur: NEGATIVE mg/dL
Specific Gravity, Urine: 1.031 — ABNORMAL HIGH (ref 1.005–1.030)
pH: 5 (ref 5.0–8.0)

## 2019-07-23 LAB — I-STAT BETA HCG BLOOD, ED (MC, WL, AP ONLY): I-stat hCG, quantitative: <5 m[IU]/mL (ref <5)

## 2019-07-23 MED ORDER — SODIUM CHLORIDE 0.9 % IV SOLN: INTRAVENOUS | Status: DC

## 2019-07-23 MED ORDER — DIPHENHYDRAMINE HCL 50 MG/ML IJ SOLN
25.0000 mg | Freq: Once | INTRAMUSCULAR | Status: AC
  Administered 2019-07-23: 25 mg via INTRAVENOUS
  Filled 2019-07-23: qty 1

## 2019-07-23 MED ORDER — SODIUM CHLORIDE 0.9 % IV BOLUS
2000.0000 mL | Freq: Once | INTRAVENOUS | Status: AC
  Administered 2019-07-23: 2000 mL via INTRAVENOUS

## 2019-07-23 MED ORDER — METOCLOPRAMIDE HCL 5 MG/ML IJ SOLN
10.0000 mg | Freq: Once | INTRAMUSCULAR | Status: AC
  Administered 2019-07-23: 10 mg via INTRAVENOUS
  Filled 2019-07-23: qty 2

## 2019-07-23 MED ORDER — INSULIN ASPART 100 UNIT/ML ~~LOC~~ SOLN
10.0000 [IU] | Freq: Once | SUBCUTANEOUS | Status: AC
  Administered 2019-07-23: 10 [IU] via SUBCUTANEOUS
  Filled 2019-07-23: qty 0.1

## 2019-07-23 NOTE — ED Provider Notes (Signed)
[facility] DEPT Provider Note   CSN: [id number] Arrival date & time: 07/23/19  1703     History   Chief Complaint Chief Complaint  Patient presents with  . Hyperglycemia    HPI Dana Davenport is a 28 y.o. female.     28 year old female with history of type 2 diabetes presents with hyperglycemia times several days.  She endorses polyuria as well as polydipsia.  Has had emesis x2 yesterday but none today.  Has had crampy abdominal pain.  Denies any diarrhea.  No vaginal bleeding or discharge.  Denies any dysuria or hematuria.  States compliance with her diabetic diet but states that she has had issues with her medication.     Past Medical History:  Diagnosis Date  . Diabetes mellitus without complication [facility])     Patient Active Problem List   Diagnosis Date Noted  . Vaginal candidiasis 05/11/2018  . Obesity 05/11/2018  . DKA (diabetic ketoacidoses) ([place]) 02/27/2018    Past Surgical History:  Procedure Laterality Date  . NO PAST SURGERIES       OB History    Gravida  0   Para  0   Term  0   Preterm  0   AB  0   Living  0     SAB  0   TAB  0   Ectopic  0   Multiple  0   Live Births               Home Medications    Prior to Admission medications   Medication Sig Start Date End Date Taking? Authorizing Provider  blood glucose meter kit and supplies Dispense based on patient and insurance preference. Use up to four times daily as directed. (FOR ICD-9 250.00, 250.01). 02/28/18   Patrecia Pour, Christean Grief, MD  clindamycin (CLEOCIN) 300 MG capsule Take 300 mg by mouth 3 (three) times daily.    [provider]  dicyclomine (BENTYL) 10 MG capsule Take 1 capsule (10 mg total) by mouth 4 (four) times daily -  before meals and at bedtime. Patient not taking: Reported on 05/11/2018 03/08/18   Shelly Coss, MD  doxycycline (VIBRAMYCIN) 100 MG capsule Take 1 capsule (100 mg total) by mouth 2 (two) times daily. Patient  not taking: Reported on 05/11/2018 04/20/18   Mackuen, Fredia Sorrow, MD  ferrous sulfate 325 (65 FE) MG tablet Take 1 tablet (325 mg total) by mouth daily with breakfast. 03/09/18   Shelly Coss, MD  insulin NPH-regular Human (NOVOLIN 70/30) (70-30) 100 UNIT/ML injection Inject 35 Units into the skin 2 (two) times daily with a meal. 05/12/18   Lavina Hamman, MD  insulin regular (NOVOLIN R,HUMULIN R) 100 units/mL injection Use  0-15 units tidwc (correction insulin if needed at home) Patient taking differently: Use  0-15 units tidwc (correction insulin if needed at home). Sliding scale. 03/08/18 03/08/19  Shelly Coss, MD  metFORMIN (GLUCOPHAGE) 1000 MG tablet Take 1 tablet (1,000 mg total) by mouth 2 (two) times daily with a meal. 05/12/18 06/11/18  Lavina Hamman, MD    Family History Family History  Problem Relation Age of Onset  . Kidney Stones Mother   . Anesthesia problems Neg Hx     Social History Social History   Tobacco Use  . Smoking status: Current Some Day Smoker    Types: Cigarettes  . Smokeless tobacco: Never Used  Substance Use Topics  . Alcohol use: No    Frequency: Never  .  Drug use: No     Allergies   Patient has no known allergies.   Review of Systems Review of Systems  All other systems reviewed and are negative.    Physical Exam Updated Vital Signs BP 122/83 (BP Location: Left Arm)   Pulse 100   Temp 98.8 F (37.1 C) (Oral)   Resp 18   Ht 1.651 m (_0 )   Wt 86.2 kg   LMP 07/01/2019   SpO2 97%   BMI 31.62 kg/m   Physical Exam Vitals signs and nursing note reviewed.  Constitutional:      General: She is not in acute distress.    Appearance: Normal appearance. She is well-developed. She is not toxic-appearing.  HENT:     Head: Normocephalic and atraumatic.  Eyes:     General: Lids are normal.     Conjunctiva/sclera: Conjunctivae normal.     Pupils: Pupils are equal, round, and reactive to light.  Neck:     Musculoskeletal: Normal range of  motion and neck supple.     Thyroid: No thyroid mass.     Trachea: No tracheal deviation.  Cardiovascular:     Rate and Rhythm: Normal rate and regular rhythm.     Heart sounds: Normal heart sounds. No murmur. No gallop.   Pulmonary:     Effort: Pulmonary effort is normal. No respiratory distress.     Breath sounds: Normal breath sounds. No stridor. No decreased breath sounds, wheezing, rhonchi or rales.  Abdominal:     General: Bowel sounds are normal. There is no distension.     Palpations: Abdomen is soft.     Tenderness: There is no abdominal tenderness. There is no rebound.  Musculoskeletal: Normal range of motion.        General: No tenderness.  Skin:    General: Skin is warm and dry.     Findings: No abrasion or rash.  Neurological:     Mental Status: She is alert and oriented to person, place, and time.     GCS: GCS eye subscore is 4. GCS verbal subscore is 5. GCS motor subscore is 6.     Cranial Nerves: No cranial nerve deficit.     Sensory: No sensory deficit.  Psychiatric:        Speech: Speech normal.        Behavior: Behavior normal.      ED Treatments / Results  Labs (all labs ordered are listed, but only abnormal results are displayed) Labs Reviewed  CBG MONITORING, ED - Abnormal; Notable for the following components:      Result Value   Glucose-Capillary >600 (*)    All other components within normal limits  BASIC METABOLIC PANEL  CBC  URINALYSIS, ROUTINE W REFLEX MICROSCOPIC  BLOOD GAS, VENOUS  I-STAT BETA HCG BLOOD, ED ([facility], [facility], [facility] ONLY)    EKG None  Radiology No results found.  Procedures Procedures (including critical care time)  Medications Ordered in ED Medications  sodium chloride 0.9 % bolus 2,000 mL (has no administration in time range)  0.9 %  sodium chloride infusion (has no administration in time range)     Initial Impression / Assessment and Plan / ED Course  I have reviewed the triage vital signs and the nursing notes.   Pertinent labs & imaging results that were available during my care of the patient were reviewed by me and considered in my medical decision making (see chart for details).  Patient treat with IV fluids here as well as insulin.  Blood sugar has improved.  She has no evidence of DKA.  Will be discharged home and encouraged to follow-up with her endocrinologist  Final Clinical Impressions(s) / ED Diagnoses   Final diagnoses:  None    ED Discharge Orders    None       Lacretia Leigh, MD 07/23/19 2104

## 2019-07-23 NOTE — ED Triage Notes (Signed)
Pt states she has just not feeling well. Pt is c/o hyperglycemia. Pt c/o fatigue, weakness, abd pain, and lightheadedness.  Pt states she takes trajea, but has been unable to obtain meds.

## 2019-11-21 IMAGING — DX DG CHEST 1V PORT
1 series · 1 of 1 positions shown · non-contrast
Comparison: 01/24/2018

CLINICAL DATA: Cough and congestion

EXAM:
PORTABLE CHEST 1 VIEW

[chest ap]
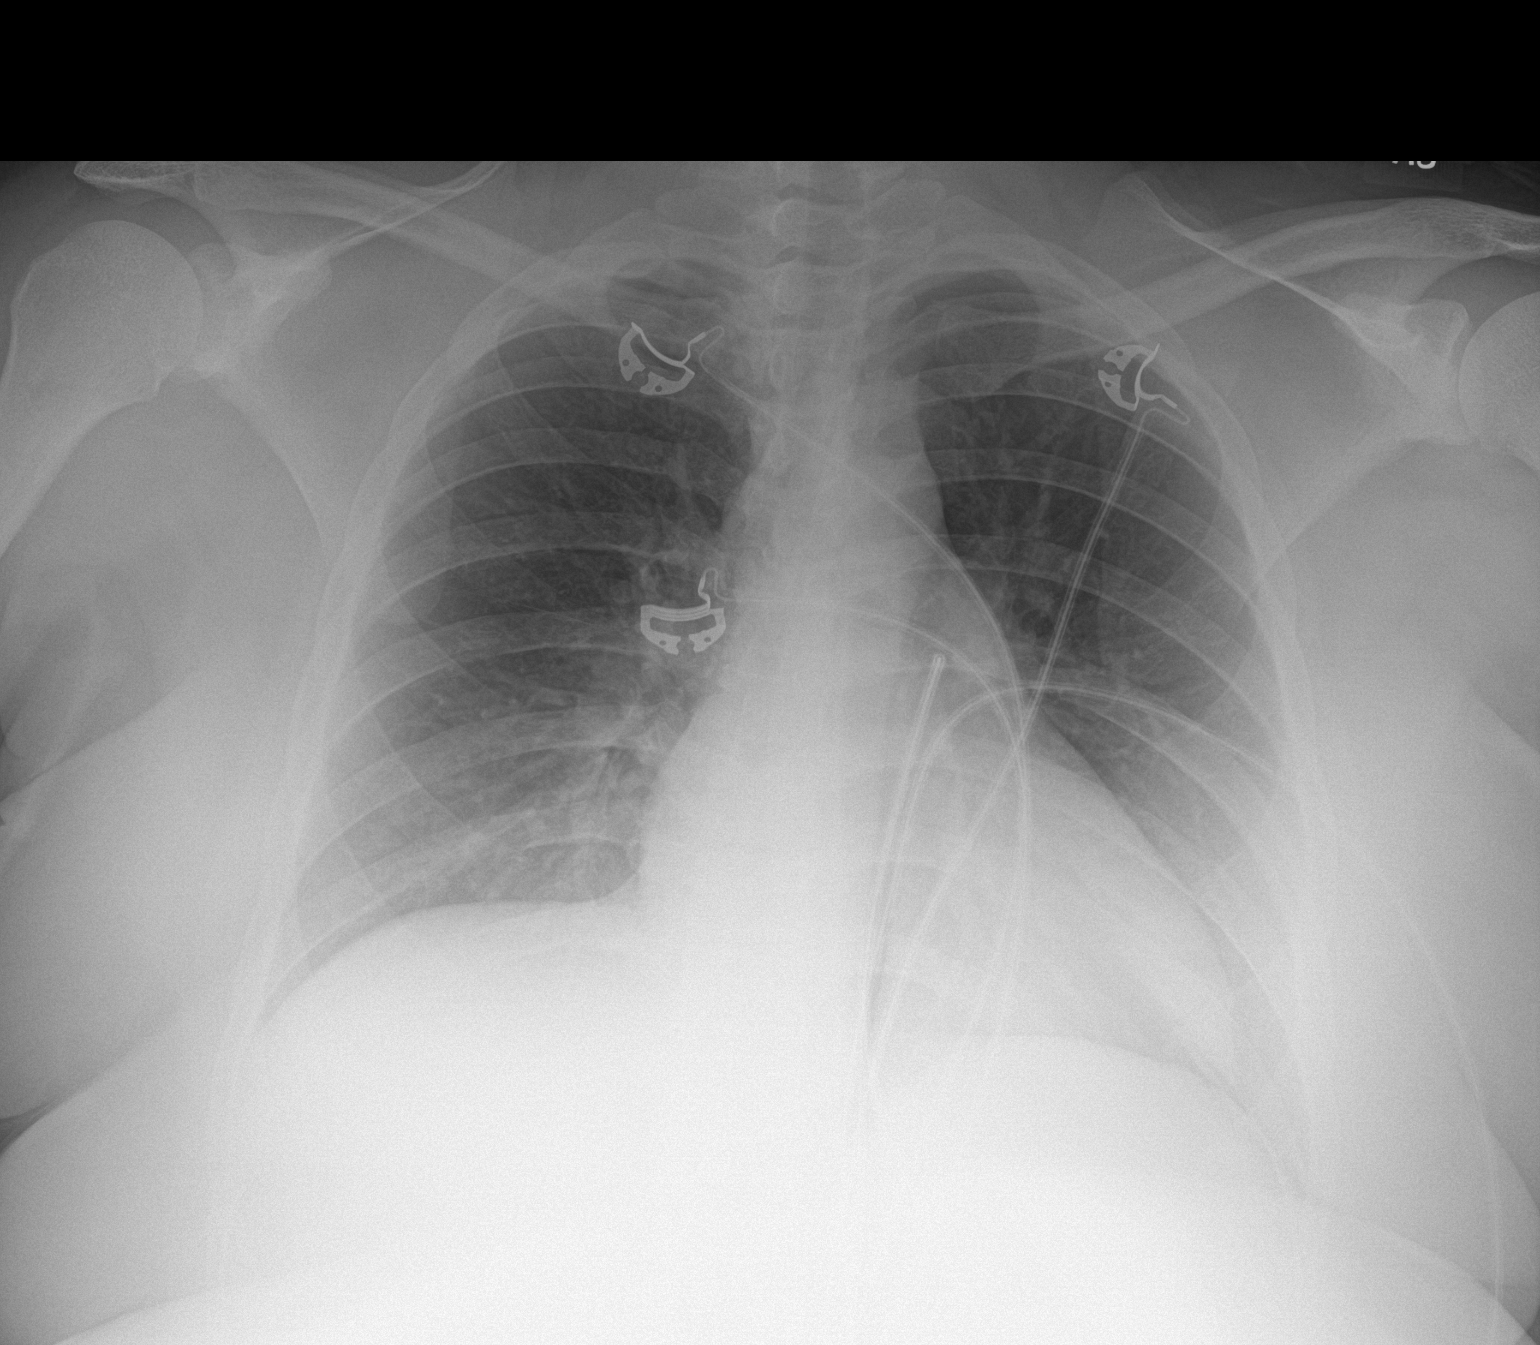

[1 of 1 positions shown; findings below may reference images not displayed]

FINDINGS: Cardiac shadow is within normal limits. The lungs are well aerated
bilaterally. No focal infiltrate or sizable effusion is seen. No
bony abnormality is noted.
IMPRESSION: No active disease.

## 2019-12-24 IMAGING — CT CT NECK W/ CM
4 of 5 series · 15 of 33 positions shown, 17 images · IV contrast (omnipaque)
Comparison: None.

CLINICAL DATA: Right-sided sore throat and ear pain since [REDACTED].
Fevers.

EXAM:
CT NECK WITH CONTRAST
TECHNIQUE: Multidetector CT imaging of the neck was performed using the
standard protocol following the bolus administration of intravenous
contrast.
CONTRAST:  75mL OMNIPAQUE IOHEXOL 300 MG/ML  SOLN

[Series 2: axial neck · axial · 0.48mm/px · z∈[-274,-166]mm · 3 of 108 slices shown]
[im 27/108  bone]
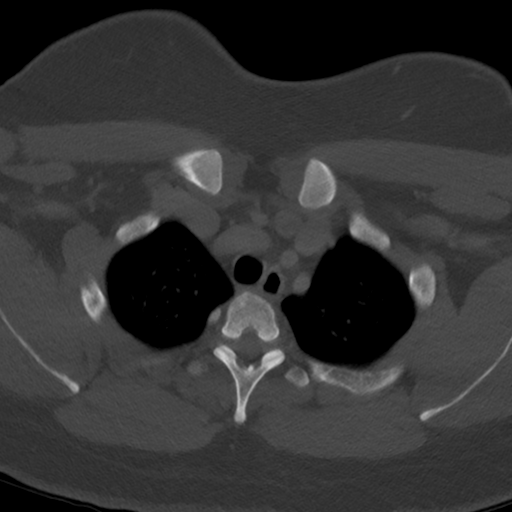
[im 54/108  bone]
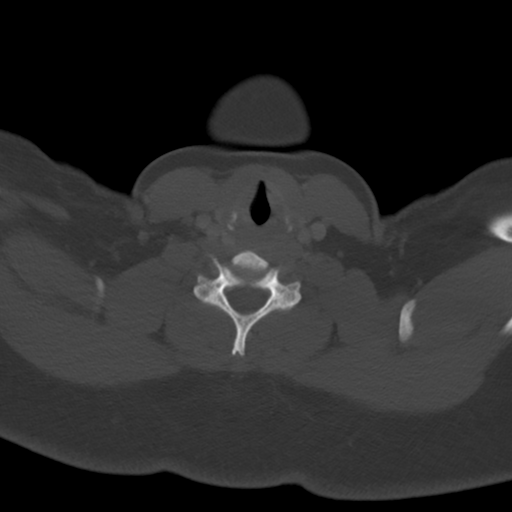
[im 81/108  bone]
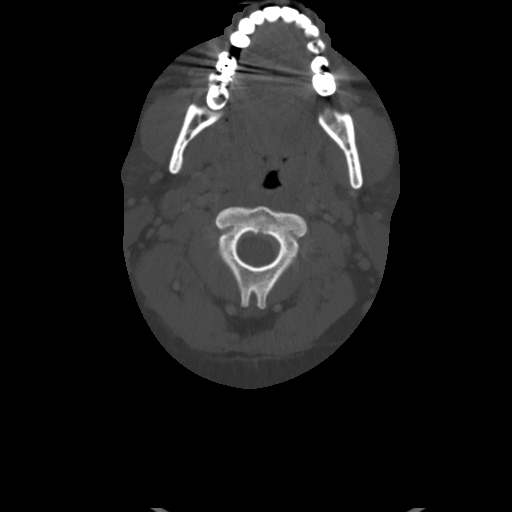

[Series 6: orthogonal ax · axial · 0.39mm/px · z∈[-280,-154]mm · 4 of 105 slices shown, 5 images]
[im 21/105  soft-tissue]
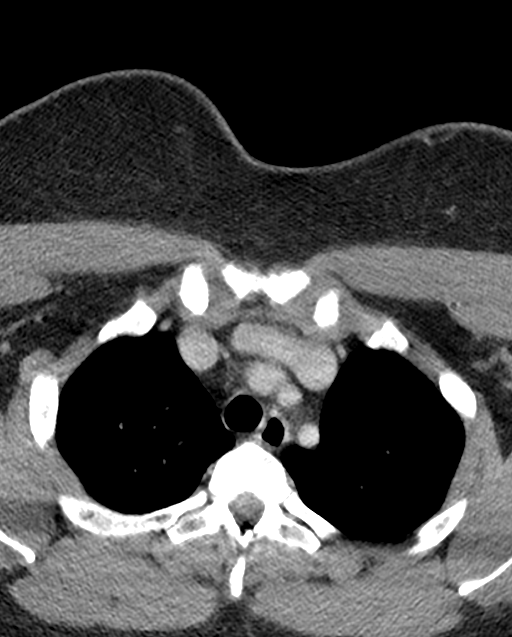
[im 21/105  bone]
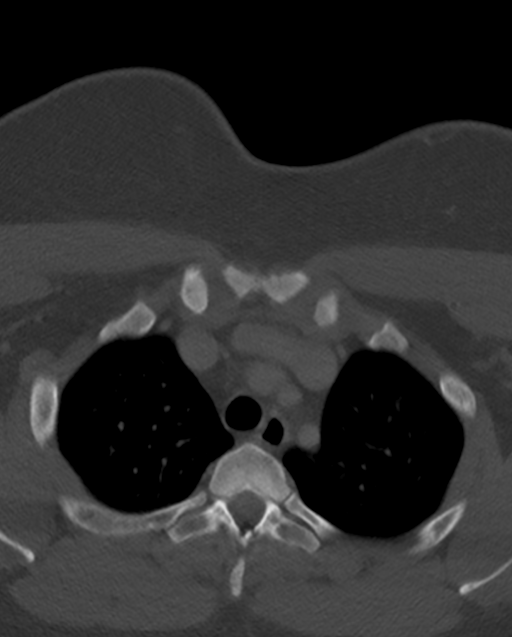
[im 42/105  bone]
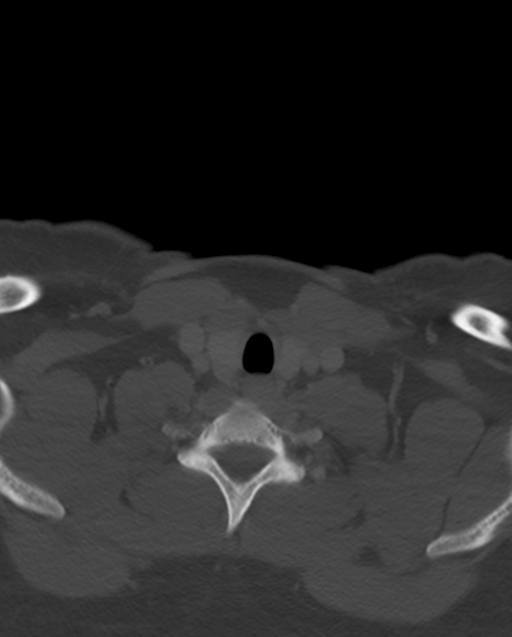
[im 63/105  bone]
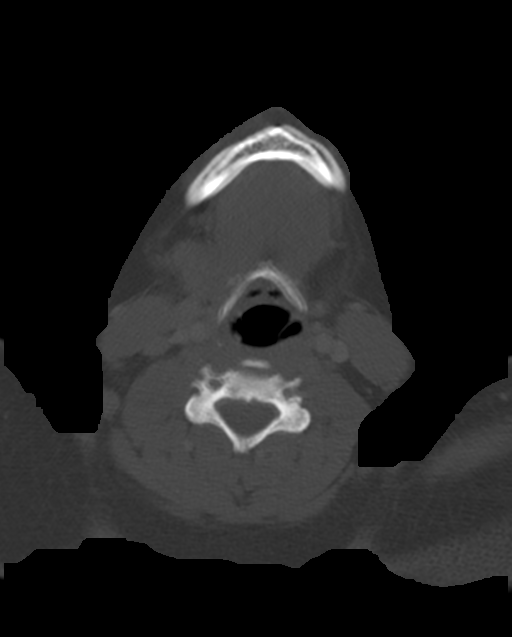
[im 84/105  bone]
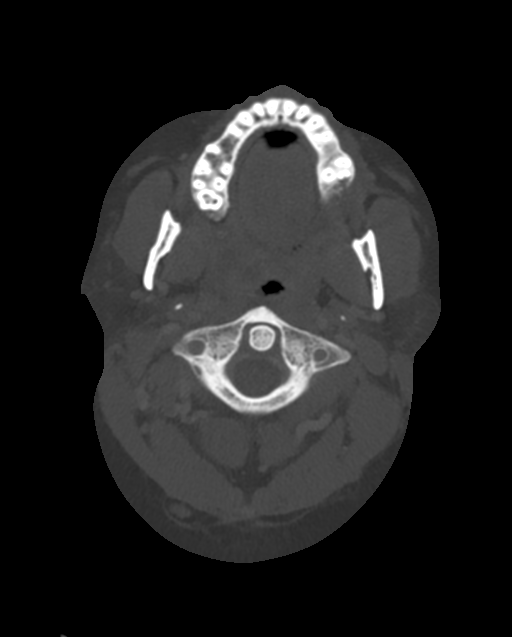

[Series 7: cor neck · coronal · 0.42mm/px · 3 of 115 slices shown]
[im 23/115  bone]
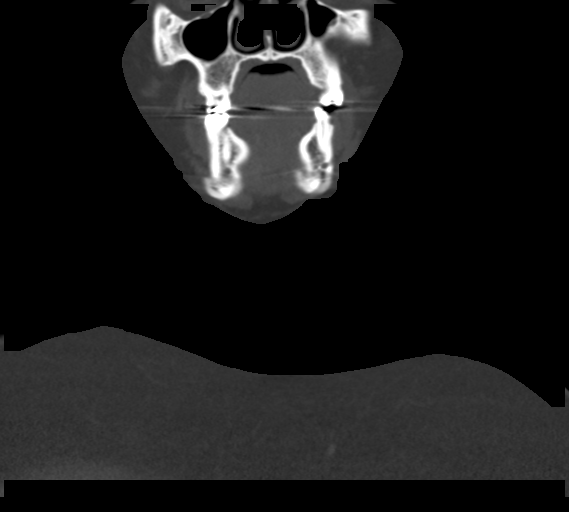
[im 46/115  bone]
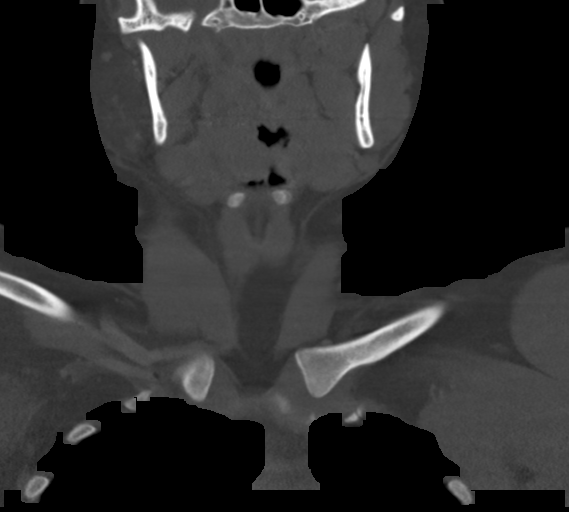
[im 69/115  bone]
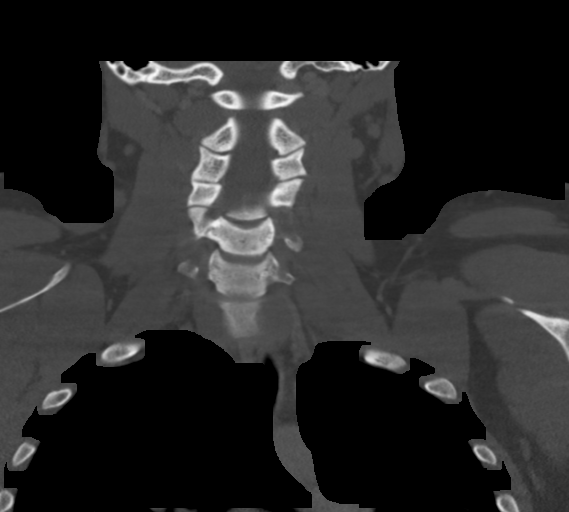

[Series 8: sag neck · sagittal · 0.42mm/px · 5 of 101 slices shown, 6 images]
[im 34/101  bone]
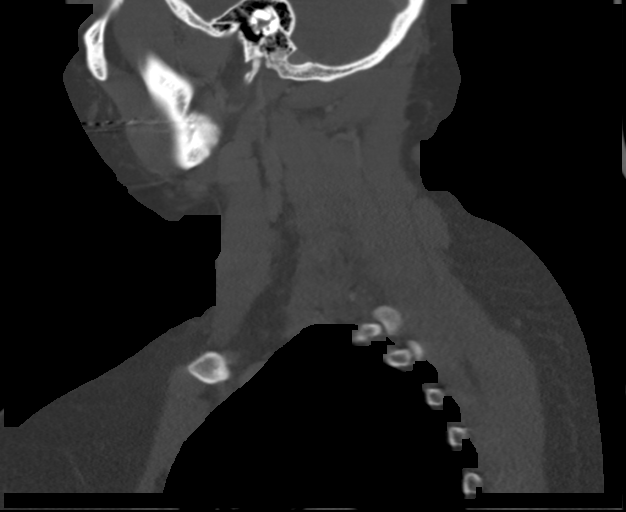
[im 42/101  bone]
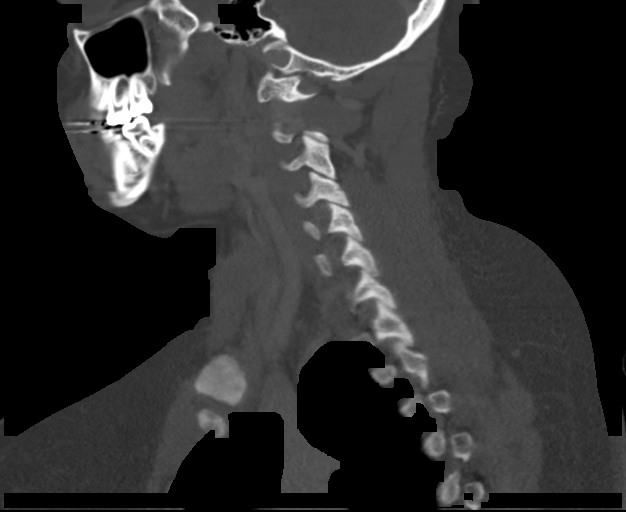
[im 51/101  soft-tissue]
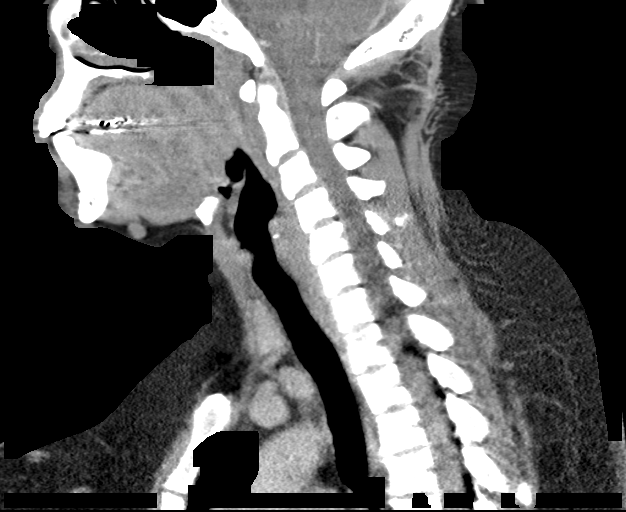
[im 51/101  bone]
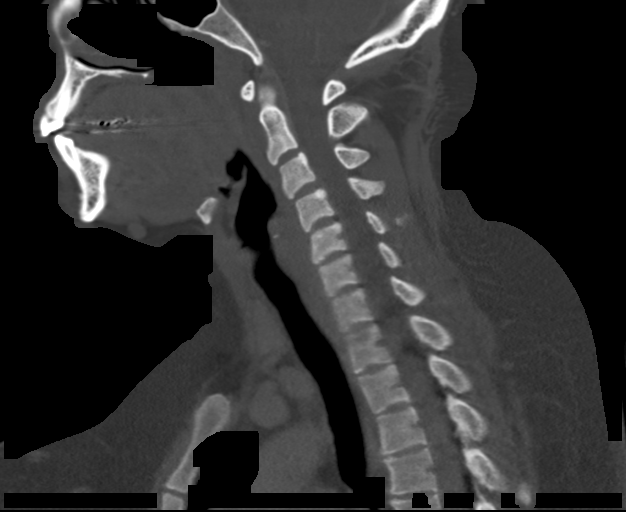
[im 59/101  bone]
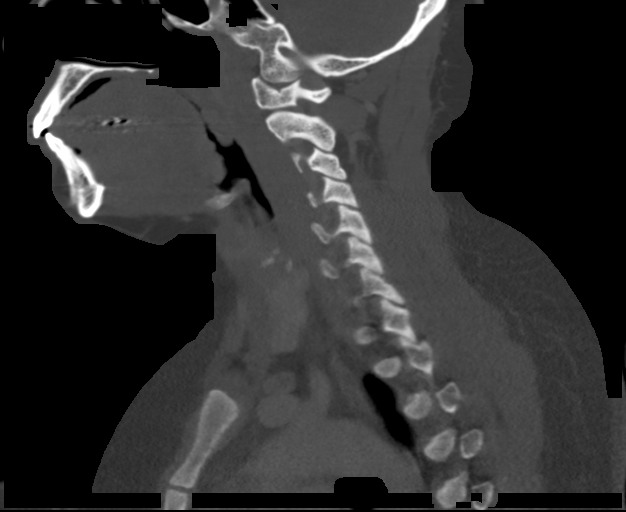
[im 67/101  bone]
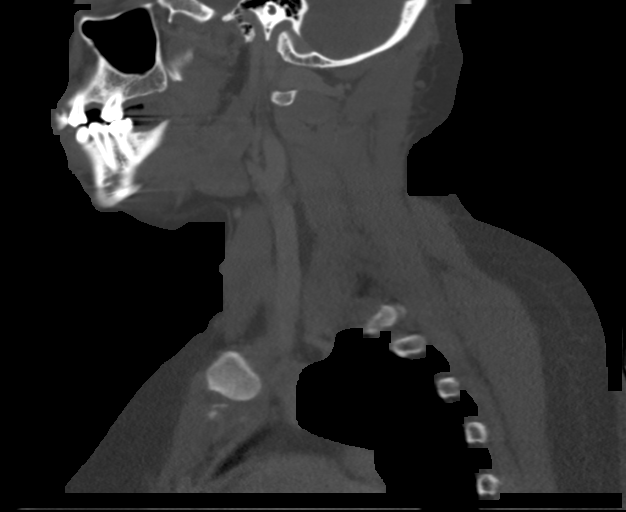

[15 of 33 positions shown; findings below may reference images not displayed]

FINDINGS: Pharynx and larynx: Mild tonsillar thickening. There is a right
peritonsillar low-density collection measuring 15 mm. No laryngeal
edema. No retropharyngeal fluid.

Salivary glands: Negative

Thyroid: Negative

Lymph nodes: Jugulodigastric nodal enlargement on the right,
considered reactive in this setting.

Vascular: Negative

Limited intracranial: Negative

Visualized orbits: Negative

Mastoids and visualized paranasal sinuses: Clear

Skeleton: Negative

Upper chest: The apical lungs are clear
IMPRESSION: 15 mm right peritonsillar abscess.

## 2021-09-15 ENCOUNTER — Emergency Department (HOSPITAL_COMMUNITY)
Admission: EM | Admit: 2021-09-15 | Discharge: 2021-09-15 | Discharge status: Against Medical Advice | Payer: BC Managed Care – PPO
